# Patient Record
Sex: Male | Born: 1940
Health system: Southern US, Community
[De-identification: ages and names within clinical notes are randomized; demographics above are authoritative.]

## PROBLEM LIST (undated history)

## (undated) DIAGNOSIS — C801 Malignant (primary) neoplasm, unspecified: Secondary | ICD-10-CM

## (undated) DIAGNOSIS — E278 Other specified disorders of adrenal gland: Secondary | ICD-10-CM

## (undated) DIAGNOSIS — I82409 Acute embolism and thrombosis of unspecified deep veins of unspecified lower extremity: Secondary | ICD-10-CM

## (undated) DIAGNOSIS — E785 Hyperlipidemia, unspecified: Secondary | ICD-10-CM

## (undated) DIAGNOSIS — I10 Essential (primary) hypertension: Secondary | ICD-10-CM

## (undated) HISTORY — DX: Essential (primary) hypertension: I10

## (undated) HISTORY — DX: Hyperlipidemia, unspecified: E78.5

## (undated) HISTORY — DX: Other specified disorders of adrenal gland: E27.8

## (undated) HISTORY — DX: Malignant (primary) neoplasm, unspecified: C80.1

---

## 1998-06-16 ENCOUNTER — Other Ambulatory Visit: Admission: RE | Admit: 1998-06-16 | Discharge: 1998-06-16 | Payer: Self-pay | Admitting: Urology

## 1998-07-29 ENCOUNTER — Encounter: Admission: RE | Admit: 1998-07-29 | Discharge: 1998-08-17 | Payer: Self-pay | Admitting: Family Medicine

## 1998-09-13 ENCOUNTER — Encounter: Admission: RE | Admit: 1998-09-13 | Discharge: 1998-12-12 | Payer: Self-pay | Admitting: Family Medicine

## 1998-11-01 ENCOUNTER — Encounter: Payer: Self-pay | Admitting: Family Medicine

## 1998-11-01 ENCOUNTER — Ambulatory Visit (HOSPITAL_COMMUNITY): Admission: RE | Admit: 1998-11-01 | Discharge: 1998-11-01 | Payer: Self-pay | Admitting: Family Medicine

## 1998-11-15 ENCOUNTER — Encounter: Payer: Self-pay | Admitting: Orthopedic Surgery

## 1998-11-15 ENCOUNTER — Ambulatory Visit (HOSPITAL_COMMUNITY): Admission: RE | Admit: 1998-11-15 | Discharge: 1998-11-15 | Payer: Self-pay | Admitting: Orthopedic Surgery

## 1998-11-29 ENCOUNTER — Encounter: Payer: Self-pay | Admitting: Orthopedic Surgery

## 1998-11-29 ENCOUNTER — Ambulatory Visit (HOSPITAL_COMMUNITY): Admission: RE | Admit: 1998-11-29 | Discharge: 1998-11-29 | Payer: Self-pay | Admitting: Orthopedic Surgery

## 1998-12-13 ENCOUNTER — Ambulatory Visit (HOSPITAL_COMMUNITY): Admission: RE | Admit: 1998-12-13 | Discharge: 1998-12-13 | Payer: Self-pay | Admitting: Orthopedic Surgery

## 1998-12-13 ENCOUNTER — Encounter: Payer: Self-pay | Admitting: Orthopedic Surgery

## 2000-07-08 ENCOUNTER — Encounter: Admission: RE | Admit: 2000-07-08 | Discharge: 2000-07-08 | Payer: Self-pay | Admitting: Internal Medicine

## 2000-07-08 ENCOUNTER — Encounter: Payer: Self-pay | Admitting: Internal Medicine

## 2000-07-17 ENCOUNTER — Encounter: Admission: RE | Admit: 2000-07-17 | Discharge: 2000-08-12 | Payer: Self-pay | Admitting: Neurosurgery

## 2005-03-13 ENCOUNTER — Inpatient Hospital Stay (HOSPITAL_COMMUNITY): Admission: EM | Admit: 2005-03-13 | Discharge: 2005-03-19 | Payer: Self-pay | Admitting: Emergency Medicine

## 2007-12-26 ENCOUNTER — Ambulatory Visit: Admission: RE | Admit: 2007-12-26 | Discharge: 2008-01-07 | Payer: Self-pay | Admitting: Radiation Oncology

## 2008-02-20 DIAGNOSIS — C801 Malignant (primary) neoplasm, unspecified: Secondary | ICD-10-CM

## 2008-02-20 HISTORY — DX: Malignant (primary) neoplasm, unspecified: C80.1

## 2008-04-22 ENCOUNTER — Ambulatory Visit: Admission: RE | Admit: 2008-04-22 | Discharge: 2008-07-21 | Payer: Self-pay | Admitting: Radiation Oncology

## 2008-04-23 ENCOUNTER — Encounter: Admission: RE | Admit: 2008-04-23 | Discharge: 2008-04-23 | Payer: Self-pay | Admitting: Urology

## 2008-05-28 ENCOUNTER — Ambulatory Visit (HOSPITAL_BASED_OUTPATIENT_CLINIC_OR_DEPARTMENT_OTHER): Admission: RE | Admit: 2008-05-28 | Discharge: 2008-05-28 | Payer: Self-pay | Admitting: Urology

## 2008-08-31 ENCOUNTER — Ambulatory Visit: Admission: RE | Admit: 2008-08-31 | Discharge: 2008-09-22 | Payer: Self-pay | Admitting: Radiation Oncology

## 2009-03-10 HISTORY — PX: OTHER SURGICAL HISTORY: SHX169

## 2010-04-05 HISTORY — PX: OTHER SURGICAL HISTORY: SHX169

## 2010-05-31 LAB — COMPREHENSIVE METABOLIC PANEL
ALT: 30 U/L (ref 0–53)
AST: 23 U/L (ref 0–37)
Albumin: 3.8 g/dL (ref 3.5–5.2)
Alkaline Phosphatase: 69 U/L (ref 39–117)
BUN: 14 mg/dL (ref 6–23)
CO2: 29 mEq/L (ref 19–32)
Calcium: 9.1 mg/dL (ref 8.4–10.5)
Chloride: 106 mEq/L (ref 96–112)
Creatinine, Ser: 1.1 mg/dL (ref 0.4–1.5)
GFR calc Af Amer: >60 mL/min (ref >60)
GFR calc non Af Amer: >60 mL/min (ref >60)
Glucose, Bld: 113 mg/dL — ABNORMAL HIGH (ref 70–99)
Potassium: 3.7 mEq/L (ref 3.5–5.1)
Sodium: 141 mEq/L (ref 135–145)
Total Bilirubin: 0.9 mg/dL (ref 0.3–1.2)
Total Protein: 6.6 g/dL (ref 6.0–8.3)

## 2010-05-31 LAB — CBC
HCT: 45 % (ref 39.0–52.0)
Hemoglobin: 15.5 g/dL (ref 13.0–17.0)
MCHC: 34.4 g/dL (ref 30.0–36.0)
MCV: 102.6 fL — ABNORMAL HIGH (ref 78.0–100.0)
Platelets: 113 10*3/uL — ABNORMAL LOW (ref 150–400)
RBC: 4.39 MIL/uL (ref 4.22–5.81)
RDW: 14.1 % (ref 11.5–15.5)
WBC: 4.7 10*3/uL (ref 4.0–10.5)

## 2010-05-31 LAB — PROTIME-INR
INR: 1 (ref 0.00–1.49)
INR: 1.9 — ABNORMAL HIGH (ref 0.00–1.49)
Prothrombin Time: 13.8 seconds (ref 11.6–15.2)
Prothrombin Time: 22.8 seconds — ABNORMAL HIGH (ref 11.6–15.2)

## 2010-05-31 LAB — APTT
aPTT: 26 seconds (ref 24–37)
aPTT: 31 seconds (ref 24–37)

## 2010-07-04 NOTE — Op Note (Signed)
NAME:  Pedro Kent, Pedro Kent               ACCOUNT NO.:  [id number]   MEDICAL RECORD NO.:  [id number]          PATIENT TYPE:  AMB   LOCATION:  [facility]                         FACILITY:  [facility]   PHYSICIAN:  Mark C. Vernie Ammons, M.D.  DATE OF BIRTH:  1940/09/08   DATE OF PROCEDURE:  05/28/2008  DATE OF DISCHARGE:                               OPERATIVE REPORT   PREOPERATIVE DIAGNOSIS:  Adenocarcinoma of the prostate.   POSTOPERATIVE DIAGNOSIS:  Adenocarcinoma of the prostate.   PROCEDURE:  I-125 radioactive seed implantation.   SURGEON:  Mark C. Vernie Ammons, M.D.   RADIATION ONCOLOGIST:  Artist Pais. Kathrynn Running, M.D.   ANESTHESIA:  General.   DRAINS:  16 French Foley catheter.   SPECIMENS:  None.   ESTIMATED BLOOD LOSS:  Minimal.   COMPLICATIONS:  None.   INDICATIONS:  The patient is a 70 year old male with biopsy proven  adenocarcinoma of the prostate.  He was found to have an apical nodule  and a PSA of 14.5 prompting a biopsy of the prostate which revealed  adenocarcinoma Gleason score 3+4 involving 3 cores from the left portion  of the prostate.  The treatment options as well as the risks and  complications of radioactive seed implant were discussed in detail with  the patient and he has elected to proceed with seed implantation.   DESCRIPTION OF OPERATION:  After informed consent the patient was  brought to the major OR and placed on the table and administered general  anesthesia.  He was then moved to the dorsal lithotomy position and the  Foley catheter was initially placed using half strength contrast in the  balloon.  The rectal probe and rectal tube were then placed and the  rectal probe was affixed to the stand.   Real time ultrasonography was then performed and real time planning was  performed by Dr. Kathrynn Running.  This indicated the need for the placement of  90 seeds using 24 needles.   Using the Nucletron and real time ultrasonography all of the needles  were placed without  difficulty and the seeds implanted.  I then removed  the rectal probe and rectal tube as well as Foley catheter and reprepped  the penis.   Cystoscopy was performed using the 17 French flexible scope.  The  urethra was noted to be normal without lesion.  The prostatic urethra  revealed some elongation but no lesions and also no foreign bodies or  stones were seen.  Upon entering the bladder I noted the bladder had  normal appearing mucosa.  It was fully and systematically inspected and  noted to be free of any tumor, stones or inflammatory lesions.  Ureteral  orifices were normal configuration and position.  There were no seeds  noted on the floor of the bladder nor were there any seeds noted  protruding from the base of the prostate upon retroflexion of the scope.  There was 1+ trabeculation noted.   The flexible cystoscope was removed and a new Foley catheter was  inserted and connected to closed system drainage and the patient was  awakened and taken to recovery  room in stable and satisfactory  condition.  He tolerated the procedure well with no intraoperative  complications.   He was given a prescription for 24 Vicodin HP, 10 Cipro 500 mg and  Flomax 0.4 mg #30.  He will follow up in my office in 3 weeks and with  Dr. Kathrynn Running at that time as well.      Mark C. Vernie Ammons, M.D.  Electronically Signed     MCO/MEDQ  D:  05/28/2008  T:  05/28/2008  Job:  [id number]   cc:   Artist Pais Kathrynn Running, M.D.  Fax: [phone]

## 2010-07-07 NOTE — Consult Note (Signed)
Pedro Kent, Pedro Kent               ACCOUNT NO.:  [id number]   MEDICAL RECORD NO.:  [id number]          PATIENT TYPE:  INP   LOCATION:  5729                         FACILITY:  [facility]   PHYSICIAN:  Alfonse Alpers. Gegick, M.D.DATE OF BIRTH:  1940-10-20   DATE OF CONSULTATION:  03/16/2005  DATE OF DISCHARGE:                                   CONSULTATION   HISTORY:  This is a 70 year old man who presented to the hospital with a  history of left leg swelling.  The patient has had a history of deep venous  thrombosis of acute onset and apparently no precipitating causes have been  detected.  A CT scan of his abdomen was done and this showed bilateral  adrenal hyperplasia.  He has a history of hypertension which has been  present for approximately 30 years.  He has been taking multiple medications  including diuretics.  He has not been taking any potassium but was started  on Spironolactone approximately one month ago and this has been increased  recently to twice a day.  The patient has also been taking a diuretic and  also a beta blocker.   PAST MEDICAL HISTORY:  Essentially negative.  One year ago, he had a cardiac  study and this was apparently normal.  He has not had any other  hospitalizations.   MEDICATIONS PRIOR TO ADMISSION:  Norvasc, enteric coated aspirin once a day,  Spironolactone 12.5 mg once daily, Diovan 300 mg once daily, Lopressor 50 mg  b.i.d., Demadex 20 mg daily.   FAMILY HISTORY:  His mother died at the age of 57, his father died at the  age of 38.  Neither of them had any history of clot problems.   PERSONAL HISTORY:  He does not smoke or drink excessive amounts of alcohol.  He is not allergic to any medications.  There is a note in the chart  indicating his intolerance to ACE inhibitors.   REVIEW OF SYSTEMS:  His weight has decreased about 40 or 50 pounds and he  relates this to diet and exercise.  He has been strenuously working out.  He  denies any use of any type  of hormonal agent.  Cardiovascular/respiratory:  No history of chest pain.  GI:  No symptoms.   PHYSICAL EXAMINATION:  GENERAL:  This is a well developed man who appears in no distress.  LUNGS:  Clear.  NECK:  Supple, the thyroid is multinodular in texture, it is slightly  enlarged.  CARDIOVASCULAR:  Rhythm is regular.  ABDOMEN:  Soft, no masses are present, no tenderness is present.  EXTREMITIES:  Moderate swelling is present in the left leg at this time.   IMPRESSION:  1.  Deep venous thrombosis of undetermined origin.  2.  Adrenal hyperplasia, etiology to be determined.   DISCUSSION:  It is quite possible that the patient has been on Diovan and,  also, Spironolactone for a chronic period of time along with diuretic  medications.  This may be causing his adrenal hyperplasia in which case it  would require medication adjustment.  The other possibility, of course,  is  that the patient has adrenal disease.  A cortisol level will be obtained.  In addition, a plasma renin activity and plasma aldosterone level will be  obtained.  These studies would be probably not very helpful in view of the  fact that he is, already, taking Aldactone and Demadex.  It will be  necessary at this time to make a definitive diagnosis to keep him off of  medications for two weeks and this includes his diuretics, beta blockers,  and angiotensin converting inhibitors along with angiotensin receptor  blockers.  After two weeks, we will do an upright study measuring plasma  renin activity, aldosterone levels, and determine whether this is autonomous  functioning of the adrenal glands.   Thank you for the opportunity in seeing this patient.           ______________________________  Alfonse Alpers. Dagoberto Ligas, M.D.     CGG/MEDQ  D:  03/16/2005  T:  03/16/2005  Job:  [id number]

## 2010-07-07 NOTE — Discharge Summary (Signed)
Pedro Kent, Pedro Kent               ACCOUNT NO.:  [id number]   MEDICAL RECORD NO.:  [id number]          PATIENT TYPE:  INP   LOCATION:  5729                         FACILITY:  [facility]   PHYSICIAN:  Nicki Guadalajara, M.D.     DATE OF BIRTH:  1940-06-12   DATE OF ADMISSION:  03/13/2005  DATE OF DISCHARGE:  03/19/2005                                 DISCHARGE SUMMARY   DISCHARGE DIAGNOSES:  1.  Deep vein thrombosis left common femoral vein, Coumadin added this      admission.  2.  History of hypertension with diastolic dysfunction.   HOSPITAL COURSE:  The patient is a pleasant 70 year old male followed by Dr.  Tresa Endo with history of hypertension. 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 He  presented to the office with the hypertension in December. Dr. Tresa Endo  adjusted his medicines then. He apparently had some swelling in his leg and  a Doppler study done on March 13, 2005 showed a free-floating thrombus  measuring 4.6 cm in the left common vein and left superficial femoral vein,  popliteal vein and peroneal vein DVT. The patient was admitted to [facility] and started on IV heparin and Coumadin. He was seen in consult by  the Endocrinology Service for possible hypoaldosteronism but Dr. Dagoberto Ligas will  work him up further if needed as an outpatient. He would need to come off  diuretics, Diovan and Aldactone for this. We feel the patient can be  discharged March 19, 2005. His INR is 2.3. we will defer further workup  for hypoaldosteronism to Dr. Tresa Endo.   DISCHARGE MEDICATIONS:  1.  Coumadin 7.5 milligrams a day.  2.  Prilosec OTC once a day.  3.  Spironolactone 12.5 milligrams a day.  4.  Diovan 320 milligrams a day.  5.  Metoprolol 50 milligrams twice a day.  6.  Torsemide 20 milligrams a day.  7.  Zyrtec p.r.n.  8.  Aspirin 81 milligrams a day.  9.  Norvasc 10 milligrams a day.  10. Multivitamin daily.   LABS:  Sodium 138, potassium 3.7, BUN 14, creatinine 1.3, INR is 2.3.  White  count 4.6, hemoglobin 14.8, hematocrit 42.5, platelets 190,000. Cortisol  level was 20.4. Stool was negative. Renin activity is pending. Calcium  ionized is 1.28. Liver functions were normal. Lipid panel shows a total  cholesterol of 164, then HDL 38, LDL 111. TSH is 0.75. PTH intact 73.2. CEA  is 2.0. PSA was 13.05. Urinalysis is unremarkable. Abdominal CT with  contrast shows thickening of the adrenal glands bilaterally consistent with  adrenal hyperplasia, there were also multiple too small to characterize low  attenuated lesions in the liver, largest in the left lobe was consistent  with a cyst. CT of the pelvis showed prostate gland enlargement and  diverticulosis without diverticulitis.   PLAN:  The patient was discharged in stable condition. He has apparently had  elevated PSA in the past with negative biopsies. We will defer further  workup to his elevated PSA to Dr. Tresa Endo. We will also defer further  endocrinologic workup to Dr. Tresa Endo. He  will have a pro time checked later  this week and see Dr. Tresa Endo in a week or two in the office.      Abelino Derrick, P.A.    ______________________________  Nicki Guadalajara, M.D.    Lenard Lance  D:  03/19/2005  T:  03/19/2005  Job:  [id number]   cc:   Nicki Guadalajara, M.D.  Fax: [phone]   Alfonse Alpers. Dagoberto Ligas, M.D.  Fax: [phone]

## 2011-02-06 ENCOUNTER — Other Ambulatory Visit: Payer: Self-pay | Admitting: Radiation Oncology

## 2011-02-06 DIAGNOSIS — C61 Malignant neoplasm of prostate: Secondary | ICD-10-CM

## 2011-06-06 ENCOUNTER — Ambulatory Visit
Admission: RE | Admit: 2011-06-06 | Discharge: 2011-06-06 | Disposition: A | Discharge status: Home / Self Care | Payer: Medicare Other | Source: Ambulatory Visit | Attending: Cardiovascular Disease | Admitting: Cardiovascular Disease

## 2011-06-06 ENCOUNTER — Other Ambulatory Visit: Payer: Self-pay | Admitting: Cardiovascular Disease

## 2011-06-06 DIAGNOSIS — R0602 Shortness of breath: Secondary | ICD-10-CM

## 2011-08-08 ENCOUNTER — Other Ambulatory Visit: Payer: Self-pay | Admitting: Radiation Oncology

## 2012-02-04 ENCOUNTER — Encounter: Payer: Self-pay | Admitting: Pharmacist Clinician (PhC)/ Clinical Pharmacy Specialist

## 2012-02-04 DIAGNOSIS — E785 Hyperlipidemia, unspecified: Secondary | ICD-10-CM

## 2012-02-04 DIAGNOSIS — I10 Essential (primary) hypertension: Secondary | ICD-10-CM

## 2012-02-05 ENCOUNTER — Other Ambulatory Visit: Payer: Self-pay | Admitting: Radiation Oncology

## 2012-05-08 ENCOUNTER — Ambulatory Visit: Payer: Self-pay | Admitting: Cardiovascular Disease

## 2012-05-08 DIAGNOSIS — Z7901 Long term (current) use of anticoagulants: Secondary | ICD-10-CM | POA: Insufficient documentation

## 2012-05-08 DIAGNOSIS — I82409 Acute embolism and thrombosis of unspecified deep veins of unspecified lower extremity: Secondary | ICD-10-CM | POA: Insufficient documentation

## 2012-07-04 ENCOUNTER — Other Ambulatory Visit: Payer: Self-pay | Admitting: *Deleted

## 2012-07-04 MED ORDER — AMLODIPINE BESYLATE 10 MG PO TABS: 10.0000 mg | ORAL_TABLET | Freq: Every day | ORAL | Status: DC

## 2012-07-04 NOTE — Telephone Encounter (Signed)
Pt last seen 05/02/12 rx sent to pharmacy by e-script

## 2012-07-15 ENCOUNTER — Other Ambulatory Visit: Payer: Self-pay | Admitting: Cardiovascular Disease

## 2012-07-16 ENCOUNTER — Other Ambulatory Visit: Payer: Self-pay | Admitting: Cardiovascular Disease

## 2012-07-21 ENCOUNTER — Ambulatory Visit (INDEPENDENT_AMBULATORY_CARE_PROVIDER_SITE_OTHER): Payer: Medicare Other | Admitting: Pharmacist Clinician (PhC)/ Clinical Pharmacy Specialist

## 2012-07-21 VITALS — BP 120/64 | HR 68

## 2012-07-21 DIAGNOSIS — Z7901 Long term (current) use of anticoagulants: Secondary | ICD-10-CM

## 2012-07-21 DIAGNOSIS — I82409 Acute embolism and thrombosis of unspecified deep veins of unspecified lower extremity: Secondary | ICD-10-CM

## 2012-07-21 LAB — POCT INR: INR: 3.3

## 2012-07-22 ENCOUNTER — Other Ambulatory Visit: Payer: Self-pay | Admitting: Cardiovascular Disease

## 2012-08-07 ENCOUNTER — Other Ambulatory Visit: Payer: Self-pay | Admitting: Radiation Oncology

## 2012-08-08 ENCOUNTER — Other Ambulatory Visit: Payer: Self-pay | Admitting: Cardiovascular Disease

## 2012-08-12 NOTE — Telephone Encounter (Signed)
Rx was sent to pharmacy electronically. 

## 2012-08-18 ENCOUNTER — Ambulatory Visit (INDEPENDENT_AMBULATORY_CARE_PROVIDER_SITE_OTHER): Payer: Medicare Other | Admitting: Pharmacist Clinician (PhC)/ Clinical Pharmacy Specialist

## 2012-08-18 VITALS — BP 120/62 | HR 76

## 2012-08-18 DIAGNOSIS — I82409 Acute embolism and thrombosis of unspecified deep veins of unspecified lower extremity: Secondary | ICD-10-CM

## 2012-08-18 DIAGNOSIS — Z7901 Long term (current) use of anticoagulants: Secondary | ICD-10-CM

## 2012-09-01 ENCOUNTER — Ambulatory Visit (INDEPENDENT_AMBULATORY_CARE_PROVIDER_SITE_OTHER): Payer: Medicare Other | Admitting: Pharmacist Clinician (PhC)/ Clinical Pharmacy Specialist

## 2012-09-01 VITALS — BP 134/70 | HR 72

## 2012-09-01 DIAGNOSIS — Z7901 Long term (current) use of anticoagulants: Secondary | ICD-10-CM

## 2012-09-01 DIAGNOSIS — I82409 Acute embolism and thrombosis of unspecified deep veins of unspecified lower extremity: Secondary | ICD-10-CM

## 2012-09-01 LAB — POCT INR: INR: 2.7

## 2012-09-17 ENCOUNTER — Other Ambulatory Visit (HOSPITAL_COMMUNITY): Payer: Self-pay | Admitting: Cardiovascular Disease

## 2012-09-17 DIAGNOSIS — I119 Hypertensive heart disease without heart failure: Secondary | ICD-10-CM

## 2012-09-23 ENCOUNTER — Ambulatory Visit (HOSPITAL_COMMUNITY)
Admission: RE | Admit: 2012-09-23 | Discharge: 2012-09-23 | Disposition: A | Discharge status: Home / Self Care | Payer: Medicare Other | Source: Ambulatory Visit | Attending: Cardiovascular Disease | Admitting: Cardiovascular Disease

## 2012-09-23 DIAGNOSIS — I059 Rheumatic mitral valve disease, unspecified: Secondary | ICD-10-CM | POA: Insufficient documentation

## 2012-09-23 DIAGNOSIS — I10 Essential (primary) hypertension: Secondary | ICD-10-CM | POA: Insufficient documentation

## 2012-09-23 DIAGNOSIS — I119 Hypertensive heart disease without heart failure: Secondary | ICD-10-CM

## 2012-09-23 DIAGNOSIS — I079 Rheumatic tricuspid valve disease, unspecified: Secondary | ICD-10-CM | POA: Insufficient documentation

## 2012-09-23 DIAGNOSIS — I517 Cardiomegaly: Secondary | ICD-10-CM | POA: Insufficient documentation

## 2012-09-23 NOTE — Progress Notes (Signed)
[facility]   2D echo completed 09/23/2012.   Cindy Louise Victory, RDCS  

## 2012-09-29 ENCOUNTER — Ambulatory Visit (INDEPENDENT_AMBULATORY_CARE_PROVIDER_SITE_OTHER): Payer: Medicare Other | Admitting: Pharmacist Clinician (PhC)/ Clinical Pharmacy Specialist

## 2012-09-29 VITALS — BP 138/68 | HR 72

## 2012-09-29 DIAGNOSIS — Z7901 Long term (current) use of anticoagulants: Secondary | ICD-10-CM

## 2012-09-29 DIAGNOSIS — I82409 Acute embolism and thrombosis of unspecified deep veins of unspecified lower extremity: Secondary | ICD-10-CM

## 2012-10-02 ENCOUNTER — Other Ambulatory Visit: Payer: Self-pay

## 2012-10-02 MED ORDER — METOPROLOL TARTRATE 50 MG PO TABS: 50.0000 mg | ORAL_TABLET | Freq: Two times a day (BID) | ORAL | Status: DC

## 2012-10-02 MED ORDER — AMLODIPINE BESYLATE 10 MG PO TABS: 10.0000 mg | ORAL_TABLET | Freq: Every day | ORAL | Status: DC

## 2012-10-02 MED ORDER — SPIRONOLACTONE 25 MG PO TABS: 25.0000 mg | ORAL_TABLET | Freq: Every day | ORAL | Status: DC

## 2012-10-02 MED ORDER — WARFARIN SODIUM 5 MG PO TABS: ORAL_TABLET | ORAL | Status: DC

## 2012-10-02 MED ORDER — ATORVASTATIN CALCIUM 10 MG PO TABS: 10.0000 mg | ORAL_TABLET | Freq: Every day | ORAL | Status: DC

## 2012-10-02 NOTE — Telephone Encounter (Signed)
Rx was sent to pharmacy electronically. 

## 2012-10-03 ENCOUNTER — Encounter: Payer: Self-pay | Admitting: *Deleted

## 2012-10-23 ENCOUNTER — Encounter: Payer: Self-pay | Admitting: Physician Assistant

## 2012-10-27 ENCOUNTER — Encounter: Payer: Self-pay | Admitting: Cardiovascular Disease

## 2012-10-27 ENCOUNTER — Ambulatory Visit (INDEPENDENT_AMBULATORY_CARE_PROVIDER_SITE_OTHER): Payer: Medicare Other | Admitting: Pharmacist Clinician (PhC)/ Clinical Pharmacy Specialist

## 2012-10-27 ENCOUNTER — Ambulatory Visit (INDEPENDENT_AMBULATORY_CARE_PROVIDER_SITE_OTHER): Payer: Medicare Other | Admitting: Cardiovascular Disease

## 2012-10-27 VITALS — BP 130/70 | HR 57 | Ht 72.0 in | Wt 217.3 lb

## 2012-10-27 DIAGNOSIS — Z7901 Long term (current) use of anticoagulants: Secondary | ICD-10-CM

## 2012-10-27 DIAGNOSIS — I82409 Acute embolism and thrombosis of unspecified deep veins of unspecified lower extremity: Secondary | ICD-10-CM

## 2012-10-27 DIAGNOSIS — I119 Hypertensive heart disease without heart failure: Secondary | ICD-10-CM

## 2012-10-27 DIAGNOSIS — R9431 Abnormal electrocardiogram [ECG] [EKG]: Secondary | ICD-10-CM

## 2012-10-27 DIAGNOSIS — Z8546 Personal history of malignant neoplasm of prostate: Secondary | ICD-10-CM

## 2012-10-27 DIAGNOSIS — I10 Essential (primary) hypertension: Secondary | ICD-10-CM

## 2012-10-27 DIAGNOSIS — E785 Hyperlipidemia, unspecified: Secondary | ICD-10-CM

## 2012-10-27 LAB — POCT INR: INR: 2.8

## 2012-10-27 NOTE — Patient Instructions (Signed)
Your physician recommends that you schedule a follow-up appointment in: 1 year. No changes has been made today in your therapy.

## 2012-10-27 NOTE — Progress Notes (Signed)
Patient ID: Pedro Kent, male   DOB: 1940/05/10, 72 y.o.   MRN: [id number]     HPI: Pedro Kent, is a 72 y.o. male who presents for six-month cardiology evaluation.  Pedro Kent has a history of deep vein thrombosis of his left lower extremity in January 2007 which is been on chronic anticoagulation therapy. Additional problems include hypertension with adrenal hyperplasia, history of prostate CA followed by Dr. Veverly Fells and with history of radiation seeds, history of hyperlipidemia, and an abnormal ECG with T wave inversion. An echo Doppler study done in January 2011  showed mild concentric LVH, mild MR with possible torn redundant chordae tendinea, mild aortic valve sclerosis, and mild coronary insufficiency. Over the past 6 months, he has continued to do well. He states he discontinued his tobacco habit which in the past he had been smoking pipes and cigars. He denies chest pressure. He is unaware of tachypalpitations. He denies shortness of breath. On 09/23/2012, Pedro Kent underwent a followup echo Doppler study. This showed an ejection fraction at 60-65%. He did have mild left ventricular hypertrophy and had normal diastolic parameters on this present study. There is very minimal pulmonary pressure elevation at 31 mm. Is mitral valve is mildly thickened with no significant regurgitation.  Past Medical History  Diagnosis Date  . Hypertension   . Cancer 2010    prostate cancer  . Hyperlipidemia   . Adrenal hyperplasia     Past Surgical History  Procedure Laterality Date  . 2-d echocardiogram  03/10/2009    Normal left ventricular systolic function. Mild MR. Normal RV systolic pressure. Mild pulmonic valvular regurgitation.  . Cardiac stress test  04/05/2010    Negative for ischemia    Allergies  Allergen Reactions  . Ace Inhibitors     Current Outpatient Prescriptions  Medication Sig Dispense Refill  . amLODipine (NORVASC) 10 MG tablet Take 1 tablet (10 mg total) by mouth  daily.  90 tablet  2  . aspirin EC 81 MG tablet Take 81 mg by mouth daily.      Marland Kitchen atorvastatin (LIPITOR) 10 MG tablet Take 1 tablet (10 mg total) by mouth daily.  90 tablet  2  . DIOVAN 320 MG tablet TAKE 1 TABLET DAILY.  90 tablet  3  . metoprolol (LOPRESSOR) 50 MG tablet Take 1 tablet (50 mg total) by mouth 2 (two) times daily.  180 tablet  2  . Misc Natural Products (MENS PROSTATE HEALTH FORMULA PO) Take 4 capsules by mouth daily.      . Multiple Vitamins-Minerals (MULTIVITAMIN) tablet Take 1 tablet by mouth daily.      Marland Kitchen omeprazole (PRILOSEC) 20 MG capsule Take 20 mg by mouth daily.      Marland Kitchen spironolactone (ALDACTONE) 25 MG tablet Take 1 tablet (25 mg total) by mouth daily.  90 tablet  2  . tamsulosin (FLOMAX) 0.4 MG CAPS TAKE ONE CAPSULE BY MOUTH TWICE A DAY  60 capsule  5  . warfarin (COUMADIN) 5 MG tablet Take 1 & 1/2 to 2 tablets by mouth daily as directed  150 tablet  2   No current facility-administered medications for this visit.    History   Social History  . Marital Status: Divorced    Spouse Name: N/A    Number of Children: N/A  . Years of Education: N/A   Occupational History  . Not on file.   Social History Main Topics  . Smoking status: Current Every Day Smoker -- 0.25 packs/day  Types: Cigars  . Smokeless tobacco: Not on file  . Alcohol Use: Yes     Comment: rarely  . Drug Use: Not on file  . Sexual Activity: Not on file   Other Topics Concern  . Not on file   Social History Narrative  . No narrative on file   Socially he has 2 children one grandchild. He does walk. He denies recent alcohol use.  History reviewed. No pertinent family history.  ROS is negative for fevers, chills or night sweats. He denies shortness of breath. He denies palpitations. No presyncope or syncope. He denies wheezing. He tells me he has no further evidence of his prior prostate cancer. He denies bleeding. He denies edema. He denies calf tenderness. Other system review is  negative.  PE BP 130/70  Pulse 57  Ht 6' (1.829 m)  Wt 217 lb 4.8 oz (98.567 kg)  BMI 29.46 kg/m2  General: Alert, oriented, no distress.  Skin: normal turgor, no rashes HEENT: Normocephalic, atraumatic. Pupils round and reactive; sclera anicteric;no lid lag.  Nose without nasal septal hypertrophy Mouth/Parynx benign; Mallinpatti scale 3 Neck: No JVD, no carotid briuts Lungs: clear to ausculatation and percussion; no wheezing or rales Heart: RRR, s1 s2 normal 1/6 systolic murmur, unchanged Abdomen: soft, nontender; no hepatosplenomehaly, BS+; abdominal aorta nontender and not dilated by palpation. Pulses 2+ Extremities: no clubbing cyanosis or edema, Homan's sign negative  Neurologic: grossly nonfocal  ECG: Sinus rhythm at 57 beats a minute. Previously noted T-wave abnormalities inferolaterally.  LABS:  BMET    Component Value Date/Time   NA 141 05/21/2008 0920   K 3.7 05/21/2008 0920   CL 106 05/21/2008 0920   CO2 29 05/21/2008 0920   GLUCOSE 113* 05/21/2008 0920   BUN 14 05/21/2008 0920   CREATININE 1.10 05/21/2008 0920   CALCIUM 9.1 05/21/2008 0920   GFRNONAA >60 05/21/2008 0920   GFRAA  Value: >60        The eGFR has been calculated using the MDRD equation. This calculation has not been validated in all clinical situations. eGFR's persistently <60 mL/min signify possible Chronic Kidney Disease. 05/21/2008 0920     Hepatic Function Panel     Component Value Date/Time   PROT 6.6 05/21/2008 0920   ALBUMIN 3.8 05/21/2008 0920   AST 23 05/21/2008 0920   ALT 30 05/21/2008 0920   ALKPHOS 69 05/21/2008 0920   BILITOT 0.9 05/21/2008 0920     CBC    Component Value Date/Time   WBC 4.7 05/21/2008 0920   RBC 4.39 05/21/2008 0920   HGB 15.5 05/21/2008 0920   HCT 45.0 05/21/2008 0920   PLT 113* 05/21/2008 0920   MCV 102.6* 05/21/2008 0920   MCHC 34.4 05/21/2008 0920   RDW 14.1 05/21/2008 0920     BNP No results found for this basename: probnp    Lipid Panel  No results found for this basename: chol,  trig, hdl, cholhdl, vldl, ldlcalc     RADIOLOGY: No results found.    ASSESSMENT AND PLAN: Pedro Kent is now 7-1/2 years following his deep vein thrombosis for which she's been on chronic Coumadin therapy. His blood pressure today is controlled. He has documented LVH with grade 1 diastolic dysfunction and abnormal T wave abnormalities. He did have a nuclear perfusion study in 2012 which showed normal perfusion with mild diaphragmatic attenuation. His most recent echo Doppler study on 09/23/2012 again shows ejection fraction 60-65% with mild LVH, mildly thickened mitral valve without significant regurgitation and  only minimal pulmonary hypertension with a slight artery systolic pressure 31 mm. I have recommended he continue his current medical regimen. As long as she remains stable I will see him in one year for followup evaluation.     Lennette Bihari, MD, [facility]  10/27/2012 3:05 PM

## 2012-11-24 ENCOUNTER — Ambulatory Visit (INDEPENDENT_AMBULATORY_CARE_PROVIDER_SITE_OTHER): Payer: Medicare Other | Admitting: Pharmacist Clinician (PhC)/ Clinical Pharmacy Specialist

## 2012-11-24 VITALS — BP 130/64 | HR 72

## 2012-11-24 DIAGNOSIS — I82409 Acute embolism and thrombosis of unspecified deep veins of unspecified lower extremity: Secondary | ICD-10-CM

## 2012-11-24 DIAGNOSIS — Z7901 Long term (current) use of anticoagulants: Secondary | ICD-10-CM

## 2012-12-16 ENCOUNTER — Encounter: Payer: Self-pay | Admitting: Cardiovascular Disease

## 2012-12-22 ENCOUNTER — Ambulatory Visit (INDEPENDENT_AMBULATORY_CARE_PROVIDER_SITE_OTHER): Payer: Medicare Other | Admitting: Pharmacist Clinician (PhC)/ Clinical Pharmacy Specialist

## 2012-12-22 VITALS — BP 124/62 | HR 68

## 2012-12-22 DIAGNOSIS — I82409 Acute embolism and thrombosis of unspecified deep veins of unspecified lower extremity: Secondary | ICD-10-CM

## 2012-12-22 DIAGNOSIS — Z7901 Long term (current) use of anticoagulants: Secondary | ICD-10-CM

## 2013-02-02 ENCOUNTER — Ambulatory Visit (INDEPENDENT_AMBULATORY_CARE_PROVIDER_SITE_OTHER): Payer: Medicare Other | Admitting: Pharmacist Clinician (PhC)/ Clinical Pharmacy Specialist

## 2013-02-02 VITALS — BP 118/66 | HR 64

## 2013-02-02 DIAGNOSIS — Z7901 Long term (current) use of anticoagulants: Secondary | ICD-10-CM

## 2013-02-02 DIAGNOSIS — I82409 Acute embolism and thrombosis of unspecified deep veins of unspecified lower extremity: Secondary | ICD-10-CM

## 2013-02-05 ENCOUNTER — Other Ambulatory Visit: Payer: Self-pay | Admitting: Radiation Oncology

## 2013-03-16 ENCOUNTER — Ambulatory Visit: Payer: Medicare Other | Admitting: Pharmacist Clinician (PhC)/ Clinical Pharmacy Specialist

## 2013-03-17 ENCOUNTER — Ambulatory Visit (INDEPENDENT_AMBULATORY_CARE_PROVIDER_SITE_OTHER): Payer: Medicare Other | Admitting: Pharmacist Clinician (PhC)/ Clinical Pharmacy Specialist

## 2013-03-17 VITALS — BP 118/60 | HR 60

## 2013-03-17 DIAGNOSIS — I82409 Acute embolism and thrombosis of unspecified deep veins of unspecified lower extremity: Secondary | ICD-10-CM

## 2013-03-17 DIAGNOSIS — Z7901 Long term (current) use of anticoagulants: Secondary | ICD-10-CM

## 2013-03-17 LAB — POCT INR: INR: 2.6

## 2013-04-29 ENCOUNTER — Ambulatory Visit (INDEPENDENT_AMBULATORY_CARE_PROVIDER_SITE_OTHER): Payer: Medicare Other | Admitting: Pharmacist Clinician (PhC)/ Clinical Pharmacy Specialist

## 2013-04-29 ENCOUNTER — Telehealth: Payer: Self-pay | Admitting: Pharmacist Clinician (PhC)/ Clinical Pharmacy Specialist

## 2013-04-29 DIAGNOSIS — I82409 Acute embolism and thrombosis of unspecified deep veins of unspecified lower extremity: Secondary | ICD-10-CM

## 2013-04-29 DIAGNOSIS — Z7901 Long term (current) use of anticoagulants: Secondary | ICD-10-CM

## 2013-04-29 LAB — POCT INR: INR: 3.1

## 2013-04-29 NOTE — Telephone Encounter (Signed)
Pt wanted to confirm INR was 3.1.  Verified with patient

## 2013-04-29 NOTE — Telephone Encounter (Signed)
Pt LMOM has question after INR appt today 3.1  Returned call, [facility]

## 2013-06-09 ENCOUNTER — Other Ambulatory Visit: Payer: Self-pay

## 2013-06-09 MED ORDER — AMLODIPINE BESYLATE 10 MG PO TABS: 10.0000 mg | ORAL_TABLET | Freq: Every day | ORAL | Status: DC

## 2013-06-09 NOTE — Telephone Encounter (Signed)
Rx was sent to pharmacy electronically. 

## 2013-06-10 ENCOUNTER — Ambulatory Visit (INDEPENDENT_AMBULATORY_CARE_PROVIDER_SITE_OTHER): Payer: Medicare Other | Admitting: Pharmacist Clinician (PhC)/ Clinical Pharmacy Specialist

## 2013-06-10 VITALS — BP 142/78 | HR 64

## 2013-06-10 DIAGNOSIS — Z7901 Long term (current) use of anticoagulants: Secondary | ICD-10-CM

## 2013-06-10 LAB — POCT INR: INR: 2.6

## 2013-07-22 ENCOUNTER — Ambulatory Visit (INDEPENDENT_AMBULATORY_CARE_PROVIDER_SITE_OTHER): Payer: Medicare Other | Admitting: Pharmacist Clinician (PhC)/ Clinical Pharmacy Specialist

## 2013-07-22 VITALS — BP 132/80 | HR 72

## 2013-07-22 DIAGNOSIS — Z7901 Long term (current) use of anticoagulants: Secondary | ICD-10-CM

## 2013-07-22 LAB — POCT INR: INR: 2.9

## 2013-07-24 ENCOUNTER — Other Ambulatory Visit: Payer: Self-pay | Admitting: *Deleted

## 2013-07-24 MED ORDER — ATORVASTATIN CALCIUM 10 MG PO TABS
10.0000 mg | ORAL_TABLET | Freq: Every day | ORAL | Status: DC
Start: 2013-07-24 — End: 2014-04-14

## 2013-07-31 ENCOUNTER — Other Ambulatory Visit: Payer: Self-pay | Admitting: *Deleted

## 2013-07-31 MED ORDER — VALSARTAN 320 MG PO TABS: 320.0000 mg | ORAL_TABLET | Freq: Every day | ORAL | Status: DC

## 2013-07-31 NOTE — Telephone Encounter (Signed)
Rx refill sent to patient pharmacy   

## 2013-08-01 ENCOUNTER — Telehealth: Payer: Self-pay | Admitting: Cardiovascular Disease

## 2013-08-03 ENCOUNTER — Other Ambulatory Visit: Payer: Self-pay | Admitting: Radiation Oncology

## 2013-08-03 NOTE — Telephone Encounter (Signed)
Closed encounter °

## 2013-08-09 ENCOUNTER — Other Ambulatory Visit: Payer: Self-pay | Admitting: Pharmacist Clinician (PhC)/ Clinical Pharmacy Specialist

## 2013-08-18 ENCOUNTER — Other Ambulatory Visit: Payer: Self-pay | Admitting: *Deleted

## 2013-08-18 MED ORDER — SPIRONOLACTONE 25 MG PO TABS: 25.0000 mg | ORAL_TABLET | Freq: Every day | ORAL | Status: DC

## 2013-08-18 NOTE — Telephone Encounter (Signed)
Rx was sent to pharmacy electronically. 

## 2013-09-02 ENCOUNTER — Ambulatory Visit (INDEPENDENT_AMBULATORY_CARE_PROVIDER_SITE_OTHER): Payer: Medicare Other | Admitting: Pharmacist

## 2013-09-02 DIAGNOSIS — Z7901 Long term (current) use of anticoagulants: Secondary | ICD-10-CM

## 2013-09-02 DIAGNOSIS — I82409 Acute embolism and thrombosis of unspecified deep veins of unspecified lower extremity: Secondary | ICD-10-CM

## 2013-09-02 LAB — POCT INR: INR: 2.9

## 2013-10-14 ENCOUNTER — Ambulatory Visit (INDEPENDENT_AMBULATORY_CARE_PROVIDER_SITE_OTHER): Payer: Medicare Other | Admitting: Pharmacist Clinician (PhC)/ Clinical Pharmacy Specialist

## 2013-10-14 VITALS — BP 126/78 | HR 68

## 2013-10-14 DIAGNOSIS — I82409 Acute embolism and thrombosis of unspecified deep veins of unspecified lower extremity: Secondary | ICD-10-CM

## 2013-10-14 DIAGNOSIS — Z7901 Long term (current) use of anticoagulants: Secondary | ICD-10-CM

## 2013-10-14 LAB — POCT INR: INR: 3

## 2013-11-09 ENCOUNTER — Other Ambulatory Visit: Payer: Self-pay | Admitting: Cardiovascular Disease

## 2013-11-10 NOTE — Telephone Encounter (Signed)
Rx was sent to pharmacy electronically. 

## 2013-11-25 ENCOUNTER — Ambulatory Visit (INDEPENDENT_AMBULATORY_CARE_PROVIDER_SITE_OTHER): Payer: Medicare Other | Admitting: Pharmacist Clinician (PhC)/ Clinical Pharmacy Specialist

## 2013-11-25 VITALS — BP 132/70 | HR 60

## 2013-11-25 DIAGNOSIS — I82409 Acute embolism and thrombosis of unspecified deep veins of unspecified lower extremity: Secondary | ICD-10-CM

## 2013-11-25 DIAGNOSIS — Z7901 Long term (current) use of anticoagulants: Secondary | ICD-10-CM

## 2013-11-25 LAB — POCT INR: INR: 2.8

## 2013-11-29 ENCOUNTER — Other Ambulatory Visit: Payer: Self-pay | Admitting: Cardiovascular Disease

## 2013-11-30 NOTE — Telephone Encounter (Signed)
Rx was sent to pharmacy electronically. OV 12/21/13

## 2013-12-21 ENCOUNTER — Ambulatory Visit (INDEPENDENT_AMBULATORY_CARE_PROVIDER_SITE_OTHER): Payer: Medicare Other | Admitting: Pharmacist Clinician (PhC)/ Clinical Pharmacy Specialist

## 2013-12-21 ENCOUNTER — Other Ambulatory Visit: Payer: Self-pay | Admitting: Cardiovascular Disease

## 2013-12-21 ENCOUNTER — Encounter: Payer: Self-pay | Admitting: Cardiovascular Disease

## 2013-12-21 ENCOUNTER — Ambulatory Visit (INDEPENDENT_AMBULATORY_CARE_PROVIDER_SITE_OTHER): Payer: Medicare Other | Admitting: Cardiovascular Disease

## 2013-12-21 VITALS — BP 120/74 | HR 62 | Ht 72.0 in | Wt 218.4 lb

## 2013-12-21 DIAGNOSIS — Z7901 Long term (current) use of anticoagulants: Secondary | ICD-10-CM

## 2013-12-21 DIAGNOSIS — I82409 Acute embolism and thrombosis of unspecified deep veins of unspecified lower extremity: Secondary | ICD-10-CM

## 2013-12-21 DIAGNOSIS — E785 Hyperlipidemia, unspecified: Secondary | ICD-10-CM

## 2013-12-21 DIAGNOSIS — Z8546 Personal history of malignant neoplasm of prostate: Secondary | ICD-10-CM

## 2013-12-21 DIAGNOSIS — R9431 Abnormal electrocardiogram [ECG] [EKG]: Secondary | ICD-10-CM

## 2013-12-21 DIAGNOSIS — Z79899 Other long term (current) drug therapy: Secondary | ICD-10-CM

## 2013-12-21 DIAGNOSIS — I10 Essential (primary) hypertension: Secondary | ICD-10-CM

## 2013-12-21 DIAGNOSIS — Z720 Tobacco use: Secondary | ICD-10-CM | POA: Insufficient documentation

## 2013-12-21 LAB — COMPREHENSIVE METABOLIC PANEL
ALBUMIN: 4.4 g/dL (ref 3.5–5.2)
ALT: 32 U/L (ref 0–53)
AST: 24 U/L (ref 0–37)
Alkaline Phosphatase: 59 U/L (ref 39–117)
BILIRUBIN TOTAL: 0.7 mg/dL (ref 0.2–1.2)
BUN: 14 mg/dL (ref 6–23)
CO2: 27 meq/L (ref 19–32)
Calcium: 9 mg/dL (ref 8.4–10.5)
Chloride: 105 mEq/L (ref 96–112)
Creat: 1.11 mg/dL (ref 0.50–1.35)
Glucose, Bld: 101 mg/dL — ABNORMAL HIGH (ref 70–99)
POTASSIUM: 4.5 meq/L (ref 3.5–5.3)
SODIUM: 139 meq/L (ref 135–145)
TOTAL PROTEIN: 6.6 g/dL (ref 6.0–8.3)

## 2013-12-21 LAB — CBC
HCT: 43.1 % (ref 39.0–52.0)
HEMOGLOBIN: 15 g/dL (ref 13.0–17.0)
MCH: 35 pg — AB (ref 26.0–34.0)
MCHC: 34.8 g/dL (ref 30.0–36.0)
MCV: 100.7 fL — ABNORMAL HIGH (ref 78.0–100.0)
Platelets: 147 10*3/uL — ABNORMAL LOW (ref 150–400)
RBC: 4.28 MIL/uL (ref 4.22–5.81)
RDW: 13.9 % (ref 11.5–15.5)
WBC: 4 10*3/uL (ref 4.0–10.5)

## 2013-12-21 LAB — LIPID PANEL
CHOLESTEROL: 132 mg/dL (ref 0–200)
HDL: 44 mg/dL (ref >39)
LDL Cholesterol: 70 mg/dL (ref 0–99)
Total CHOL/HDL Ratio: 3 Ratio
Triglycerides: 88 mg/dL (ref <150)
VLDL: 18 mg/dL (ref 0–40)

## 2013-12-21 LAB — TSH: TSH: 0.879 u[IU]/mL (ref 0.350–4.500)

## 2013-12-21 LAB — POCT INR: INR: 2.7

## 2013-12-21 NOTE — Patient Instructions (Signed)
Your physician recommends that you return for lab work fasting.  Your physician wants you to follow-up in: 1 year or sooner if needed with Dr. Kelly. You will receive a reminder letter in the mail two months in advance. If you don't receive a letter, please call our office to schedule the follow-up appointment. 

## 2013-12-21 NOTE — Progress Notes (Signed)
Patient ID: Pedro Kent, male   DOB: [birth year], 73 y.o.   MRN: [id number]      HPI: Pedro Kent is a 73 y.o. male who presents for a one-year follow-up cardiology evaluation.  Pedro Kent developed a  deep vein thrombosis of his left lower extremity in January 2007and has been on chronic anticoagulation therapy with Coumadin ever since. Additional problems include hypertension with adrenal hyperplasia, history of prostate CA followed with history of radiation seeds, history of hyperlipidemia, and an abnormal ECG with T wave inversion. An echo Doppler study in January 2011  showed mild concentric LVH, mild MR with possible torn redundant chordae tendinea, mild aortic valve sclerosis, and mild coronary insufficiency.   On 09/23/2012 an echo Doppler study showed an ejection fraction at 60-65%. He had mild left ventricular hypertrophy and had normal diastolic parameters on this present study. There is very minimal pulmonary pressure elevation at 31 mm. Is mitral valve is mildly thickened with no significant regurgitation.  Over the past year, he feels that he is done fairly well.  He is on a 4 drug regimen for hypertension, consisting of amlodipine 10 mg, metoprolol 50 mg twice a day, spironolactone 25 mg daily and valsartan 320 mg.  He is on Prilosec for GERD.  He is taking Lipitor 10 mg for hyperlipidemia.  He is on Coumadin anticoagulation.  An INR today was checked and this is 2.7.  He continues to smoke cigars typically smokes 2 small cigars per day.    Past Medical History  Diagnosis Date  . Hypertension   . Cancer 2010    prostate cancer  . Hyperlipidemia   . Adrenal hyperplasia     Past Surgical History  Procedure Laterality Date  . 2-d echocardiogram  03/10/2009    Normal left ventricular systolic function. Mild MR. Normal RV systolic pressure. Mild pulmonic valvular regurgitation.  . Cardiac stress test  04/05/2010    Negative for ischemia    Allergies  Allergen  Reactions  . Ace Inhibitors     Current Outpatient Prescriptions  Medication Sig Dispense Refill  . amLODipine (NORVASC) 10 MG tablet Take 1 tablet (10 mg total) by mouth daily. 90 tablet 1  . aspirin EC 81 MG tablet Take 81 mg by mouth daily.    Marland Kitchen atorvastatin (LIPITOR) 10 MG tablet Take 1 tablet (10 mg total) by mouth daily. 90 tablet 2  . metoprolol (LOPRESSOR) 50 MG tablet Take 1 tablet (50 mg total) by mouth 2 (two) times daily. <appointment with Dr. Claiborne Billings on 12/21/13> 180 tablet 0  . Misc Natural Products (MENS PROSTATE HEALTH FORMULA PO) Take 4 capsules by mouth daily.    . Multiple Vitamins-Minerals (MULTIVITAMIN) tablet Take 1 tablet by mouth daily.    Marland Kitchen omeprazole (PRILOSEC) 20 MG capsule Take 20 mg by mouth daily.    Marland Kitchen spironolactone (ALDACTONE) 25 MG tablet Take 1 tablet (25 mg total) by mouth daily. MUST KEEP APPOINTMENT 12/21/2013 FOR FUTURE REFILLS. 90 tablet 0  . tamsulosin (FLOMAX) 0.4 MG CAPS capsule TAKE ONE CAPSULE BY MOUTH TWICE A DAY 60 capsule 5  . valsartan (DIOVAN) 320 MG tablet Take 1 tablet (320 mg total) by mouth daily. 90 tablet 2  . warfarin (COUMADIN) 5 MG tablet TAKE 1 & 1/2 TO 2 TABLETS BY MOUTH DAILY AS DIRECTED 150 tablet 2   No current facility-administered medications for this visit.    History   Social History  . Marital Status: Divorced    Spouse Name:  N/A    Number of Children: N/A  . Years of Education: N/A   Occupational History  . Not on file.   Social History Main Topics  . Smoking status: Current Every Day Smoker -- 0.25 packs/day    Types: Cigars  . Smokeless tobacco: Not on file  . Alcohol Use: Yes     Comment: rarely  . Drug Use: Not on file  . Sexual Activity: Not on file   Other Topics Concern  . Not on file   Social History Narrative   Socially he has 2 children one grandchild. He does walk. He denies recent alcohol use.  History reviewed. No pertinent family history.   ROS General: Negative; No fevers, chills, or  night sweats;  HEENT: Negative; No changes in vision or hearing, sinus congestion, difficulty swallowing Pulmonary: Negative; No cough, wheezing, shortness of breath, hemoptysis Cardiovascular: Negative; No chest pain, presyncope, syncope, palpitations GI: positive for GERD on omeprazole No nausea, vomiting, diarrhea, or abdominal pain GU: Negative; No dysuria, hematuria, or difficulty voiding Musculoskeletal: Negative; no myalgias, joint pain, or weakness Hematologic/Oncology: Negative; no easy bruising, bleeding Endocrine: Negative; no heat/cold intolerance; no diabetes Neuro: Negative; no changes in balance, headaches Skin: Negative; No rashes or skin lesions Psychiatric: Negative; No behavioral problems, depression Sleep: Negative; No snoring, daytime sleepiness, hypersomnolence, bruxism, restless legs, hypnogognic hallucinations, no cataplexy Other comprehensive 14 point system review is negative.   PE BP 120/74 mmHg  Pulse 62  Ht 6' (1.829 m)  Wt 218 lb 6.4 oz (99.066 kg)  BMI 29.61 kg/m2  General: Alert, oriented, no distress.  Skin: normal turgor, no rashes HEENT: Normocephalic, atraumatic. Pupils round and reactive; sclera anicteric;no lid lag.  Nose without nasal septal hypertrophy Mouth/Parynx benign; Mallinpatti scale 3 Neck: No JVD, no carotid bruits with normal carotid upstroke Lungs: clear to ausculatation and percussion; no wheezing or rales Chest wall: Nontender to palpation Heart: RRR, s1 s2 normal 1/6 systolic murmur, unchanged; .  No diastolic murmur.  No rubs thrills or heaves Abdomen: soft, nontender; no hepatosplenomehaly, BS+; abdominal aorta nontender and not dilated by palpation. Back: No CVA tenderness Pulses 2+ Extremities: no clubbing cyanosis or edema, Homan's sign negative  Neurologic: grossly nonfocal Psychological: Normal affect and mood  ECG (independently read by me): Normal sinus rhythm at 62 bpm.  Poor R-wave progression previously noted  T-wave inversion inferolaterally  Prior September 2014 ECG: Sinus rhythm at 57 beats a minute. Previously noted T-wave abnormalities inferolaterally.  LABS:  BMET    Component Value Date/Time   NA 141 05/21/2008 0920   K 3.7 05/21/2008 0920   CL 106 05/21/2008 0920   CO2 29 05/21/2008 0920   GLUCOSE 113* 05/21/2008 0920   BUN 14 05/21/2008 0920   CREATININE 1.10 05/21/2008 0920   CALCIUM 9.1 05/21/2008 0920   GFRNONAA >60 05/21/2008 0920   GFRAA  05/21/2008 0920    >60        The eGFR has been calculated using the MDRD equation. This calculation has not been validated in all clinical situations. eGFR's persistently <60 mL/min signify possible Chronic Kidney Disease.     Hepatic Function Panel     Component Value Date/Time   PROT 6.6 05/21/2008 0920   ALBUMIN 3.8 05/21/2008 0920   AST 23 05/21/2008 0920   ALT 30 05/21/2008 0920   ALKPHOS 69 05/21/2008 0920   BILITOT 0.9 05/21/2008 0920     CBC    Component Value Date/Time   WBC 4.7 05/21/2008 0920  RBC 4.39 05/21/2008 0920   HGB 15.5 05/21/2008 0920   HCT 45.0 05/21/2008 0920   PLT 113* 05/21/2008 0920   MCV 102.6* 05/21/2008 0920   MCHC 34.4 05/21/2008 0920   RDW 14.1 05/21/2008 0920     BNP No results found for: PROBNP  Lipid Panel  No results found for: CHOL   RADIOLOGY: No results found.    ASSESSMENT AND PLAN: Pedro Kent is a 73 year old African-American gentleman who is now 8 years since developing his deep vein thrombosis. He has been on chronic Coumadin therapy ever since and his INR today is therapeutic at 2.7. His blood pressure today is controlledand he is on a 4 drug regimen consisting of valsartan.  3.  Return 20 mg, spironolactone 25 mg, metoprolol titrate 51 g twice a day in addition to amlodipine 10 mg.  He has been on Lipitor 10 mg for hyperlipidemia.  He also is on omeprazole for GERD.  He states he's not had laboratory checked in over a year.  He is fasting today.  I will check  a complete set of laboratory, including a PSA level, chemistry, CBC, lipid studies, thyroid function studies.  I discussed the importance of tobacco cessation.  He continues to have previously diagnosed inferolateral T wave abnormalities.  These have not changed.  He denies any episodes of chest pain. He has documented LVH with grade 1 diastolic dysfunction and abnormal T wave abnormalities. A nuclear perfusion study in 2012 which showed normal perfusion with mild diaphragmatic attenuation. His last echo Doppler study on 09/23/2012 continue to reveal normal LV function with ejection fraction 60-65% with mild LVH, mildly thickened mitral valve without significant regurgitation and only minimal pulmonary hypertension with a slight artery systolic pressure 31 mm. I have recommended he continue his current medical regimen. As long as he remains stable I will see him in one year for followup evaluation.     Troy Sine, MD, [facility]  12/21/2013 10:15 AM

## 2013-12-22 LAB — PSA: PSA: 0.05 ng/mL (ref <=4.00)

## 2013-12-22 NOTE — Telephone Encounter (Signed)
Rx was sent to pharmacy electronically. 

## 2014-01-04 ENCOUNTER — Encounter: Payer: Self-pay | Admitting: *Deleted

## 2014-02-01 ENCOUNTER — Ambulatory Visit (INDEPENDENT_AMBULATORY_CARE_PROVIDER_SITE_OTHER): Payer: Medicare Other | Admitting: Pharmacist Clinician (PhC)/ Clinical Pharmacy Specialist

## 2014-02-01 VITALS — BP 116/70 | HR 64

## 2014-02-01 DIAGNOSIS — Z7901 Long term (current) use of anticoagulants: Secondary | ICD-10-CM

## 2014-02-01 DIAGNOSIS — I82409 Acute embolism and thrombosis of unspecified deep veins of unspecified lower extremity: Secondary | ICD-10-CM

## 2014-02-01 LAB — POCT INR: INR: 3.3

## 2014-02-04 ENCOUNTER — Other Ambulatory Visit: Payer: Self-pay | Admitting: Cardiovascular Disease

## 2014-02-04 ENCOUNTER — Other Ambulatory Visit: Payer: Self-pay | Admitting: Radiation Oncology

## 2014-02-04 NOTE — Telephone Encounter (Signed)
Rx was sent to pharmacy electronically. 

## 2014-02-08 ENCOUNTER — Other Ambulatory Visit: Payer: Self-pay | Admitting: Radiation Oncology

## 2014-02-22 ENCOUNTER — Other Ambulatory Visit: Payer: Self-pay | Admitting: Cardiovascular Disease

## 2014-02-22 NOTE — Telephone Encounter (Signed)
Rx(s) sent to pharmacy electronically.  

## 2014-03-10 ENCOUNTER — Telehealth: Payer: Self-pay | Admitting: Radiation Oncology

## 2014-03-10 NOTE — Telephone Encounter (Signed)
Received fax from [facility] requesting approval for 90 day supply. Tammi Klippel wrote script last on 02/09/2014 with 5 refill thus, this RN approved a 90 supply.

## 2014-03-15 ENCOUNTER — Ambulatory Visit: Payer: Medicare Other | Admitting: Pharmacist Clinician (PhC)/ Clinical Pharmacy Specialist

## 2014-03-16 ENCOUNTER — Ambulatory Visit (INDEPENDENT_AMBULATORY_CARE_PROVIDER_SITE_OTHER): Payer: Medicare Other | Admitting: Pharmacist Clinician (PhC)/ Clinical Pharmacy Specialist

## 2014-03-16 VITALS — BP 120/72 | HR 68

## 2014-03-16 DIAGNOSIS — I82409 Acute embolism and thrombosis of unspecified deep veins of unspecified lower extremity: Secondary | ICD-10-CM

## 2014-03-16 DIAGNOSIS — Z7901 Long term (current) use of anticoagulants: Secondary | ICD-10-CM

## 2014-03-16 LAB — POCT INR: INR: 1.5

## 2014-04-05 ENCOUNTER — Ambulatory Visit (INDEPENDENT_AMBULATORY_CARE_PROVIDER_SITE_OTHER): Payer: Medicare Other | Admitting: Pharmacist Clinician (PhC)/ Clinical Pharmacy Specialist

## 2014-04-05 VITALS — BP 118/70 | HR 64

## 2014-04-05 DIAGNOSIS — I82409 Acute embolism and thrombosis of unspecified deep veins of unspecified lower extremity: Secondary | ICD-10-CM

## 2014-04-05 DIAGNOSIS — Z7901 Long term (current) use of anticoagulants: Secondary | ICD-10-CM

## 2014-04-05 LAB — POCT INR: INR: 2.8

## 2014-04-11 ENCOUNTER — Other Ambulatory Visit: Payer: Self-pay | Admitting: Pharmacist Clinician (PhC)/ Clinical Pharmacy Specialist

## 2014-04-14 ENCOUNTER — Other Ambulatory Visit: Payer: Self-pay | Admitting: Cardiovascular Disease

## 2014-04-14 NOTE — Telephone Encounter (Signed)
Rx(s) sent to pharmacy electronically.  

## 2014-04-22 ENCOUNTER — Other Ambulatory Visit: Payer: Self-pay | Admitting: Cardiovascular Disease

## 2014-04-22 NOTE — Telephone Encounter (Signed)
Valsartan refilled. Atorvastatin refilled 04/14/14 #90 with 2 refills

## 2014-05-03 ENCOUNTER — Ambulatory Visit (INDEPENDENT_AMBULATORY_CARE_PROVIDER_SITE_OTHER): Payer: Medicare Other | Admitting: Pharmacist Clinician (PhC)/ Clinical Pharmacy Specialist

## 2014-05-03 VITALS — BP 140/72 | HR 76

## 2014-05-03 DIAGNOSIS — I82409 Acute embolism and thrombosis of unspecified deep veins of unspecified lower extremity: Secondary | ICD-10-CM

## 2014-05-03 DIAGNOSIS — Z7901 Long term (current) use of anticoagulants: Secondary | ICD-10-CM

## 2014-05-03 LAB — POCT INR: INR: 3

## 2014-06-14 ENCOUNTER — Ambulatory Visit (INDEPENDENT_AMBULATORY_CARE_PROVIDER_SITE_OTHER): Payer: Medicare Other | Admitting: Pharmacist Clinician (PhC)/ Clinical Pharmacy Specialist

## 2014-06-14 DIAGNOSIS — Z7901 Long term (current) use of anticoagulants: Secondary | ICD-10-CM

## 2014-06-14 DIAGNOSIS — I82409 Acute embolism and thrombosis of unspecified deep veins of unspecified lower extremity: Secondary | ICD-10-CM

## 2014-06-14 LAB — POCT INR: INR: 2.7

## 2014-07-28 ENCOUNTER — Ambulatory Visit (INDEPENDENT_AMBULATORY_CARE_PROVIDER_SITE_OTHER): Payer: Medicare Other | Admitting: Pharmacist Clinician (PhC)/ Clinical Pharmacy Specialist

## 2014-07-28 VITALS — BP 112/64 | HR 68

## 2014-07-28 DIAGNOSIS — Z7901 Long term (current) use of anticoagulants: Secondary | ICD-10-CM

## 2014-07-28 DIAGNOSIS — I82409 Acute embolism and thrombosis of unspecified deep veins of unspecified lower extremity: Secondary | ICD-10-CM

## 2014-07-28 LAB — POCT INR: INR: 3.2

## 2014-08-25 ENCOUNTER — Ambulatory Visit (INDEPENDENT_AMBULATORY_CARE_PROVIDER_SITE_OTHER): Payer: Medicare Other | Admitting: Pharmacist Clinician (PhC)/ Clinical Pharmacy Specialist

## 2014-08-25 VITALS — BP 120/68 | HR 72

## 2014-08-25 DIAGNOSIS — I82409 Acute embolism and thrombosis of unspecified deep veins of unspecified lower extremity: Secondary | ICD-10-CM

## 2014-08-25 DIAGNOSIS — Z7901 Long term (current) use of anticoagulants: Secondary | ICD-10-CM | POA: Diagnosis not present

## 2014-08-25 LAB — POCT INR: INR: 3

## 2014-09-09 ENCOUNTER — Other Ambulatory Visit: Payer: Self-pay | Admitting: Radiation Oncology

## 2014-09-22 ENCOUNTER — Ambulatory Visit (INDEPENDENT_AMBULATORY_CARE_PROVIDER_SITE_OTHER): Payer: Medicare Other | Admitting: Pharmacist Clinician (PhC)/ Clinical Pharmacy Specialist

## 2014-09-22 VITALS — BP 114/68 | HR 68

## 2014-09-22 DIAGNOSIS — I82409 Acute embolism and thrombosis of unspecified deep veins of unspecified lower extremity: Secondary | ICD-10-CM

## 2014-09-22 DIAGNOSIS — Z7901 Long term (current) use of anticoagulants: Secondary | ICD-10-CM

## 2014-09-22 LAB — POCT INR: INR: 2.8

## 2014-10-05 ENCOUNTER — Telehealth: Payer: Self-pay | Admitting: Cardiovascular Disease

## 2014-10-05 NOTE — Telephone Encounter (Signed)
Closed encounter °

## 2014-10-31 ENCOUNTER — Other Ambulatory Visit: Payer: Self-pay | Admitting: Cardiovascular Disease

## 2014-11-03 ENCOUNTER — Ambulatory Visit (INDEPENDENT_AMBULATORY_CARE_PROVIDER_SITE_OTHER): Payer: Medicare Other | Admitting: Pharmacist Clinician (PhC)/ Clinical Pharmacy Specialist

## 2014-11-03 VITALS — BP 122/70 | HR 72

## 2014-11-03 DIAGNOSIS — I82409 Acute embolism and thrombosis of unspecified deep veins of unspecified lower extremity: Secondary | ICD-10-CM

## 2014-11-03 DIAGNOSIS — Z7901 Long term (current) use of anticoagulants: Secondary | ICD-10-CM

## 2014-11-03 LAB — POCT INR: INR: 3.8

## 2014-11-24 ENCOUNTER — Ambulatory Visit (INDEPENDENT_AMBULATORY_CARE_PROVIDER_SITE_OTHER): Payer: Medicare Other | Admitting: Pharmacist Clinician (PhC)/ Clinical Pharmacy Specialist

## 2014-11-24 DIAGNOSIS — Z7901 Long term (current) use of anticoagulants: Secondary | ICD-10-CM

## 2014-11-24 DIAGNOSIS — I82409 Acute embolism and thrombosis of unspecified deep veins of unspecified lower extremity: Secondary | ICD-10-CM | POA: Diagnosis not present

## 2014-11-24 LAB — POCT INR: INR: 3.1

## 2014-12-15 ENCOUNTER — Ambulatory Visit (INDEPENDENT_AMBULATORY_CARE_PROVIDER_SITE_OTHER): Payer: Medicare Other | Admitting: Pharmacist Clinician (PhC)/ Clinical Pharmacy Specialist

## 2014-12-15 DIAGNOSIS — Z7901 Long term (current) use of anticoagulants: Secondary | ICD-10-CM | POA: Diagnosis not present

## 2014-12-15 DIAGNOSIS — I82409 Acute embolism and thrombosis of unspecified deep veins of unspecified lower extremity: Secondary | ICD-10-CM

## 2014-12-15 LAB — POCT INR: INR: 3.4

## 2014-12-21 ENCOUNTER — Other Ambulatory Visit: Payer: Self-pay | Admitting: Cardiovascular Disease

## 2014-12-29 ENCOUNTER — Ambulatory Visit (INDEPENDENT_AMBULATORY_CARE_PROVIDER_SITE_OTHER): Payer: Medicare Other | Admitting: Pharmacist Clinician (PhC)/ Clinical Pharmacy Specialist

## 2014-12-29 VITALS — BP 140/64 | HR 68

## 2014-12-29 DIAGNOSIS — I82409 Acute embolism and thrombosis of unspecified deep veins of unspecified lower extremity: Secondary | ICD-10-CM

## 2014-12-29 DIAGNOSIS — Z7901 Long term (current) use of anticoagulants: Secondary | ICD-10-CM

## 2014-12-29 LAB — POCT INR: INR: 2.7

## 2015-01-15 ENCOUNTER — Other Ambulatory Visit: Payer: Self-pay | Admitting: Cardiovascular Disease

## 2015-01-17 NOTE — Telephone Encounter (Signed)
Rx(s) sent to pharmacy electronically.  

## 2015-01-19 ENCOUNTER — Other Ambulatory Visit: Payer: Self-pay | Admitting: Cardiovascular Disease

## 2015-01-20 ENCOUNTER — Encounter: Payer: Self-pay | Admitting: Cardiovascular Disease

## 2015-01-20 ENCOUNTER — Ambulatory Visit (INDEPENDENT_AMBULATORY_CARE_PROVIDER_SITE_OTHER): Payer: Medicare Other | Admitting: Cardiovascular Disease

## 2015-01-20 ENCOUNTER — Ambulatory Visit (INDEPENDENT_AMBULATORY_CARE_PROVIDER_SITE_OTHER): Payer: Medicare Other | Admitting: Pharmacist Clinician (PhC)/ Clinical Pharmacy Specialist

## 2015-01-20 VITALS — BP 124/70 | HR 60 | Ht 72.0 in | Wt 200.0 lb

## 2015-01-20 DIAGNOSIS — Z720 Tobacco use: Secondary | ICD-10-CM

## 2015-01-20 DIAGNOSIS — R5383 Other fatigue: Secondary | ICD-10-CM

## 2015-01-20 DIAGNOSIS — E785 Hyperlipidemia, unspecified: Secondary | ICD-10-CM

## 2015-01-20 DIAGNOSIS — Z7901 Long term (current) use of anticoagulants: Secondary | ICD-10-CM | POA: Diagnosis not present

## 2015-01-20 DIAGNOSIS — I10 Essential (primary) hypertension: Secondary | ICD-10-CM | POA: Diagnosis not present

## 2015-01-20 DIAGNOSIS — I82409 Acute embolism and thrombosis of unspecified deep veins of unspecified lower extremity: Secondary | ICD-10-CM

## 2015-01-20 LAB — COMPREHENSIVE METABOLIC PANEL
ALBUMIN: 4 g/dL (ref 3.6–5.1)
ALK PHOS: 58 U/L (ref 40–115)
ALT: 30 U/L (ref 9–46)
AST: 31 U/L (ref 10–35)
BILIRUBIN TOTAL: 0.9 mg/dL (ref 0.2–1.2)
BUN: 25 mg/dL (ref 7–25)
CALCIUM: 9.3 mg/dL (ref 8.6–10.3)
CO2: 25 mmol/L (ref 20–31)
Chloride: 105 mmol/L (ref 98–110)
Creat: 1.19 mg/dL — ABNORMAL HIGH (ref 0.70–1.18)
GLUCOSE: 84 mg/dL (ref 65–99)
POTASSIUM: 4.6 mmol/L (ref 3.5–5.3)
Sodium: 138 mmol/L (ref 135–146)
Total Protein: 6.8 g/dL (ref 6.1–8.1)

## 2015-01-20 LAB — LIPID PANEL
CHOL/HDL RATIO: 2.8 ratio (ref <=5.0)
CHOLESTEROL: 128 mg/dL (ref 125–200)
HDL: 45 mg/dL (ref >=40)
LDL Cholesterol: 74 mg/dL (ref <130)
TRIGLYCERIDES: 46 mg/dL (ref <150)
VLDL: 9 mg/dL (ref <30)

## 2015-01-20 LAB — CBC
HEMATOCRIT: 41.4 % (ref 39.0–52.0)
HEMOGLOBIN: 14.3 g/dL (ref 13.0–17.0)
MCH: 35 pg — ABNORMAL HIGH (ref 26.0–34.0)
MCHC: 34.5 g/dL (ref 30.0–36.0)
MCV: 101.5 fL — AB (ref 78.0–100.0)
MPV: 10.4 fL (ref 8.6–12.4)
Platelets: 151 10*3/uL (ref 150–400)
RBC: 4.08 MIL/uL — ABNORMAL LOW (ref 4.22–5.81)
RDW: 13.9 % (ref 11.5–15.5)
WBC: 4.9 10*3/uL (ref 4.0–10.5)

## 2015-01-20 LAB — POCT INR: INR: 2.8

## 2015-01-20 LAB — TSH: TSH: 0.932 u[IU]/mL (ref 0.350–4.500)

## 2015-01-20 MED ORDER — METOPROLOL TARTRATE 50 MG PO TABS: 25.0000 mg | ORAL_TABLET | Freq: Two times a day (BID) | ORAL | Status: DC

## 2015-01-20 NOTE — Telephone Encounter (Signed)
REFILL 

## 2015-01-20 NOTE — Patient Instructions (Addendum)
Your physician has recommended you make the following change in your medication: the lopressor has been changed to 1/2 tablet twice a day.  Your physician recommends that you return for lab work in: fasting.  Your physician wants you to follow-up in: 6 months or sooner if needed. You will receive a reminder letter in the mail two months in advance. If you don't receive a letter, please call our office to schedule the follow-up appointment.   If you need a refill on your cardiac medications before your next appointment, please call your pharmacy.

## 2015-01-20 NOTE — Progress Notes (Signed)
Patient ID: Pedro Kent, male   DOB: 12-23-1940, 74 y.o.   MRN: [id number]      HPI: Pedro Kent is a 74 y.o. male who presents for a one-year follow-up cardiology evaluation.  Pedro Kent developed a  deep vein thrombosis of his left lower extremity in January 2007and has been on chronic anticoagulation therapy with Coumadin ever since. Additional problems include hypertension with adrenal hyperplasia, history of prostate CA followed with history of radiation seeds, history of hyperlipidemia, and an abnormal ECG with T wave inversion. An echo Doppler study in January 2011  showed mild concentric LVH, mild MR with possible torn redundant chordae tendinea, mild aortic valve sclerosis, and mild coronary insufficiency.   On 09/23/2012 an echo Doppler study showed an ejection fraction at 60-65%. Pedro Kent had mild left ventricular hypertrophy and had normal diastolic parameters on this present study. There is very minimal pulmonary pressure elevation at 31 mm. Is mitral valve is mildly thickened with no significant regurgitation.  Over the past year, Pedro Kent feels that Pedro Kent is done fairly well.   Pedro Kent denies any episodes of chest pain or significant shortness of breath.  However, his chief complaint is that of decreased energy and more fatigability.  Pedro Kent admits to sleeping well and denies any daytime sleepiness. Pedro Kent is on a 4 drug regimen for hypertension, consisting of amlodipine 10 mg, metoprolol 50 mg twice a day, spironolactone 25 mg daily and valsartan 320 mg.  Pedro Kent is on Prilosec for GERD.  Pedro Kent is taking atorvastatin 10 mg for .  Pedro Kent continues to smoke small cigars.  Pedro Kent has not had any recent laboratory.  Hepresents for one-year evaluation.    Past Medical History  Diagnosis Date  . Hypertension   . Cancer [facility]) 2010    prostate cancer  . Hyperlipidemia   . Adrenal hyperplasia [facility])     Past Surgical History  Procedure Laterality Date  . 2-d echocardiogram  03/10/2009    Normal left ventricular  systolic function. Mild MR. Normal RV systolic pressure. Mild pulmonic valvular regurgitation.  . Cardiac stress test  04/05/2010    Negative for ischemia    Allergies  Allergen Reactions  . Ace Inhibitors     Current Outpatient Prescriptions  Medication Sig Dispense Refill  . amLODipine (NORVASC) 10 MG tablet TAKE 1 TABLET (10 MG TOTAL) BY MOUTH DAILY. 90 tablet 0  . aspirin EC 81 MG tablet Take 81 mg by mouth daily.    Nyoka Cowden Tea, Camillia sinensis, (GREEN TEA PO) Take by mouth. Cup of Tea daily.    . metoprolol (LOPRESSOR) 50 MG tablet Take 0.5 tablets (25 mg total) by mouth 2 (two) times daily. 180 tablet 3  . Misc Natural Products (MENS PROSTATE HEALTH FORMULA PO) Take 4 capsules by mouth daily.    . Multiple Vitamins-Minerals (MULTIVITAMIN) tablet Take 1 tablet by mouth daily.    . NON FORMULARY Spoon full of Flax Seeds daily.    . NON FORMULARY Blue Berry Extract daily    . omeprazole (PRILOSEC) 20 MG capsule Take 20 mg by mouth daily.    Marland Kitchen spironolactone (ALDACTONE) 25 MG tablet Take 1 tablet (25 mg total) by mouth daily. 90 tablet 3  . tamsulosin (FLOMAX) 0.4 MG CAPS capsule TAKE 1 CAPSULE TWICE A DAY 180 capsule 1  . valsartan (DIOVAN) 320 MG tablet TAKE 1 TABLET (320 MG TOTAL) BY MOUTH DAILY. 90 tablet 2  . warfarin (COUMADIN) 5 MG tablet TAKE 1 & 1/2 TO 2  TABLETS BY MOUTH DAILY AS DIRECTED 150 tablet 1  . atorvastatin (LIPITOR) 10 MG tablet TAKE 1 TABLET (10 MG TOTAL) BY MOUTH DAILY. 90 tablet 2   No current facility-administered medications for this visit.    Social History   Social History  . Marital Status: Divorced    Spouse Name: N/A  . Number of Children: N/A  . Years of Education: N/A   Occupational History  . Not on file.   Social History Main Topics  . Smoking status: Current Every Day Smoker -- 0.25 packs/day    Types: Cigars  . Smokeless tobacco: Not on file  . Alcohol Use: Yes     Comment: rarely  . Drug Use: Not on file  . Sexual Activity: Not  on file   Other Topics Concern  . Not on file   Social History Narrative   Socially Pedro Kent has 2 children one grandchild. Pedro Kent does walk. Pedro Kent denies recent alcohol use.  No family history on file.   ROS General: Negative; No fevers, chills, or night sweats;  HEENT: Negative; No changes in vision or hearing, sinus congestion, difficulty swallowing Pulmonary: Negative; No cough, wheezing, shortness of breath, hemoptysis Cardiovascular: Negative; No chest pain, presyncope, syncope, palpitations GI: positive for GERD on omeprazole No nausea, vomiting, diarrhea, or abdominal pain GU: Negative; No dysuria, hematuria, or difficulty voiding Musculoskeletal: Negative; no myalgias, joint pain, or weakness Hematologic/Oncology: Negative; no easy bruising, bleeding Endocrine: Negative; no heat/cold intolerance; no diabetes Neuro: Negative; no changes in balance, headaches Skin: Negative; No rashes or skin lesions Psychiatric: Negative; No behavioral problems, depression Sleep: Negative; No snoring, daytime sleepiness, hypersomnolence, bruxism, restless legs, hypnogognic hallucinations, no cataplexy Other comprehensive 14 point system review is negative.    PE BP 124/70 mmHg  Pulse 60  Ht 6' (1.829 m)  Wt 200 lb (90.719 kg)  BMI 27.12 kg/m2   Wt Readings from Last 3 Encounters:  01/20/15 200 lb (90.719 kg)  12/21/13 218 lb 6.4 oz (99.066 kg)  10/27/12 217 lb 4.8 oz (98.567 kg)   General: Alert, oriented, no distress.  Skin: normal turgor, no rashes HEENT: Normocephalic, atraumatic. Pupils round and reactive; sclera anicteric;no lid lag.  Nose without nasal septal hypertrophy Mouth/Parynx benign; Mallinpatti scale 3 Neck: No JVD, no carotid bruits with normal carotid upstroke Lungs: clear to ausculatation and percussion; no wheezing or rales Chest wall: Nontender to palpation Heart: RRR, s1 s2 normal 1/6 systolic murmur, unchanged; .  No diastolic murmur.  No rubs thrills or  heaves Abdomen: soft, nontender; no hepatosplenomehaly, BS+; abdominal aorta nontender and not dilated by palpation. Back: No CVA tenderness Pulses 2+ Extremities: no clubbing cyanosis or edema, Homan's sign negative  Neurologic: grossly nonfocal Psychological: Normal affect and mood  ECG (independently read by me):  Normal sinus rhythm with an occasional PAC. Previously noted inferolateral T wave inversion  November 2015ECG (independently read by me): Normal sinus rhythm at 62 bpm.  Poor R-wave progression previously noted T-wave inversion inferolaterally  Prior September 2014 ECG: Sinus rhythm at 57 beats a minute. Previously noted T-wave abnormalities inferolaterally.  LABS: BMP Latest Ref Rng 12/21/2013 05/21/2008  Glucose 70 - 99 mg/dL 101(H) 113(H)  BUN 6 - 23 mg/dL 14 14  Creatinine 0.50 - 1.35 mg/dL 1.11 1.10  Sodium 135 - 145 mEq/L 139 141  Potassium 3.5 - 5.3 mEq/L 4.5 3.7  Chloride 96 - 112 mEq/L 105 106  CO2 19 - 32 mEq/L 27 29  Calcium 8.4 - 10.5 mg/dL 9.0  9.1   Hepatic Function Latest Ref Rng 12/21/2013 05/21/2008  Total Protein 6.0 - 8.3 g/dL 6.6 6.6  Albumin 3.5 - 5.2 g/dL 4.4 3.8  AST 0 - 37 U/L 24 23  ALT 0 - 53 U/L 32 30  Alk Phosphatase 39 - 117 U/L 59 69  Total Bilirubin 0.2 - 1.2 mg/dL 0.7 0.9   CBC Latest Ref Rng 12/21/2013 05/21/2008  WBC 4.0 - 10.5 K/uL 4.0 4.7  Hemoglobin 13.0 - 17.0 g/dL 15.0 15.5  Hematocrit 39.0 - 52.0 % 43.1 45.0  Platelets 150 - 400 K/uL 147(L) 113(L)   Lab Results  Component Value Date   MCV 100.7* 12/21/2013   MCV 102.6* 05/21/2008   Lab Results  Component Value Date   TSH 0.879 12/21/2013  No results found for: HGBA1C   Lipid Panel     Component Value Date/Time   CHOL 132 12/21/2013 1112   TRIG 88 12/21/2013 1112   HDL 44 12/21/2013 1112   CHOLHDL 3.0 12/21/2013 1112   VLDL 18 12/21/2013 1112   LDLCALC 70 12/21/2013 1112     ASSESSMENT AND PLAN: Pedro Kent is a 74 year old African-American gentleman who is 9 years  since developing his deep vein thrombosis. Pedro Kent has been on chronic Coumadin therapy ever since and has tolerated this without bleeding issues. His blood pressure today is controlled and Pedro Kent is on a 4 drug regimen consisting of valsartan 320 mg, spironolactone 25 mg, metoprolol titrate 50 mg twice a day in addition to amlodipine 10 mg.  Pedro Kent has been on Lipitor 10 mg for hyperlipidemia.  Pedro Kent also is on omeprazole for GERD.   Pedro Kent has noted some decreased energy.Marland Kitchen  His resting pulse is 60.  As long as his blood pressure remained stable, I suggested a trial of reducing his metoprolol to 25 mg twice a day.  Pedro Kent will monitor his blood pressure to make certain this does not increase and will also make certain Pedro Kent is not developed any palpitations.  Pedro Kent is not had recent blood work.  A complete set of fasting laboratory will be obtained.  I will contact Pedro Kent regarding the results if changes need to be made to his medical therapy.  His long as she remains stable, I will see Pedro Kent in 6 months for reevaluation.   Ti me spent: 25 minutes  Troy Sine, MD, [facility]  01/20/2015 6:32 PM

## 2015-01-24 ENCOUNTER — Other Ambulatory Visit: Payer: Self-pay | Admitting: Cardiovascular Disease

## 2015-01-25 NOTE — Telephone Encounter (Signed)
Rx(s) sent to pharmacy electronically.  

## 2015-02-01 ENCOUNTER — Encounter: Payer: Self-pay | Admitting: *Deleted

## 2015-02-06 ENCOUNTER — Other Ambulatory Visit: Payer: Self-pay | Admitting: Cardiovascular Disease

## 2015-02-07 NOTE — Telephone Encounter (Signed)
Rx(s) sent to pharmacy electronically.  

## 2015-03-04 ENCOUNTER — Ambulatory Visit (INDEPENDENT_AMBULATORY_CARE_PROVIDER_SITE_OTHER): Payer: Medicare Other | Admitting: Pharmacist Clinician (PhC)/ Clinical Pharmacy Specialist

## 2015-03-04 ENCOUNTER — Other Ambulatory Visit: Payer: Self-pay | Admitting: Cardiovascular Disease

## 2015-03-04 DIAGNOSIS — I82409 Acute embolism and thrombosis of unspecified deep veins of unspecified lower extremity: Secondary | ICD-10-CM | POA: Diagnosis not present

## 2015-03-04 DIAGNOSIS — Z7901 Long term (current) use of anticoagulants: Secondary | ICD-10-CM

## 2015-03-04 LAB — POCT INR: INR: 3

## 2015-03-08 ENCOUNTER — Telehealth: Payer: Self-pay | Admitting: Radiation Oncology

## 2015-03-08 ENCOUNTER — Other Ambulatory Visit: Payer: Self-pay | Admitting: Cardiovascular Disease

## 2015-03-08 MED ORDER — AMLODIPINE BESYLATE 10 MG PO TABS: ORAL_TABLET | ORAL | Status: DC

## 2015-03-08 NOTE — Telephone Encounter (Signed)
RX refilled  

## 2015-03-08 NOTE — Telephone Encounter (Signed)
Received message from [facility] requesting a return call reference this patient. Called back and spoke with [facility], pharmacist. She reports the [facility]. She requested tamsulosin script. Provided verbal order for tamsulin 0.4 mg bid, 90 day supply, and 5 refills as order by Dr. Tammi Klippel.

## 2015-03-08 NOTE — Telephone Encounter (Signed)
°*  STAT* If patient is at the pharmacy, call can be transferred to refill team.   1. Which medications need to be refilled? (please list name of each medication and dose if known) Amlodipine  2. Which pharmacy/location (including street and city if local pharmacy) is medication to be sent [id number] RX  3. Do they need a 30 day or 90 day supply?90 and refills

## 2015-03-10 ENCOUNTER — Other Ambulatory Visit: Payer: Self-pay | Admitting: Radiation Oncology

## 2015-03-15 ENCOUNTER — Encounter: Payer: Self-pay | Admitting: Pharmacist Clinician (PhC)/ Clinical Pharmacy Specialist

## 2015-03-28 ENCOUNTER — Other Ambulatory Visit: Payer: Self-pay | Admitting: Cardiovascular Disease

## 2015-03-28 MED ORDER — AMLODIPINE BESYLATE 10 MG PO TABS: ORAL_TABLET | ORAL | Status: DC

## 2015-03-29 ENCOUNTER — Encounter: Payer: Self-pay | Admitting: *Deleted

## 2015-03-29 NOTE — Telephone Encounter (Signed)
This encounter was created in error - please disregard.

## 2015-04-15 ENCOUNTER — Encounter: Payer: Medicare Other | Admitting: Pharmacist Clinician (PhC)/ Clinical Pharmacy Specialist

## 2015-04-16 ENCOUNTER — Other Ambulatory Visit: Payer: Self-pay | Admitting: Cardiovascular Disease

## 2015-04-18 ENCOUNTER — Ambulatory Visit (INDEPENDENT_AMBULATORY_CARE_PROVIDER_SITE_OTHER): Payer: Medicare Other | Admitting: Pharmacist Clinician (PhC)/ Clinical Pharmacy Specialist

## 2015-04-18 DIAGNOSIS — I82409 Acute embolism and thrombosis of unspecified deep veins of unspecified lower extremity: Secondary | ICD-10-CM

## 2015-04-18 DIAGNOSIS — Z7901 Long term (current) use of anticoagulants: Secondary | ICD-10-CM

## 2015-04-18 LAB — POCT INR: INR: 2.8

## 2015-04-18 NOTE — Telephone Encounter (Signed)
R

## 2015-04-19 ENCOUNTER — Other Ambulatory Visit: Payer: Self-pay | Admitting: Cardiovascular Disease

## 2015-05-30 ENCOUNTER — Ambulatory Visit (INDEPENDENT_AMBULATORY_CARE_PROVIDER_SITE_OTHER): Payer: Medicare Other | Admitting: Pharmacist Clinician (PhC)/ Clinical Pharmacy Specialist

## 2015-05-30 DIAGNOSIS — Z7901 Long term (current) use of anticoagulants: Secondary | ICD-10-CM | POA: Diagnosis not present

## 2015-05-30 DIAGNOSIS — I82409 Acute embolism and thrombosis of unspecified deep veins of unspecified lower extremity: Secondary | ICD-10-CM

## 2015-05-30 LAB — POCT INR: INR: 2.2

## 2015-06-01 ENCOUNTER — Other Ambulatory Visit: Payer: Self-pay | Admitting: *Deleted

## 2015-06-01 MED ORDER — WARFARIN SODIUM 5 MG PO TABS: ORAL_TABLET | ORAL | Status: DC

## 2015-06-08 ENCOUNTER — Other Ambulatory Visit: Payer: Self-pay | Admitting: Cardiovascular Disease

## 2015-06-08 NOTE — Telephone Encounter (Signed)
Rx request sent to pharmacy.  

## 2015-06-28 ENCOUNTER — Encounter: Payer: Self-pay | Admitting: Cardiovascular Disease

## 2015-06-28 ENCOUNTER — Ambulatory Visit (INDEPENDENT_AMBULATORY_CARE_PROVIDER_SITE_OTHER): Payer: Medicare Other | Admitting: Cardiovascular Disease

## 2015-06-28 VITALS — BP 137/77 | HR 71 | Ht 72.0 in | Wt 201.6 lb

## 2015-06-28 DIAGNOSIS — E611 Iron deficiency: Secondary | ICD-10-CM

## 2015-06-28 DIAGNOSIS — E785 Hyperlipidemia, unspecified: Secondary | ICD-10-CM

## 2015-06-28 DIAGNOSIS — F419 Anxiety disorder, unspecified: Secondary | ICD-10-CM

## 2015-06-28 DIAGNOSIS — Z720 Tobacco use: Secondary | ICD-10-CM | POA: Diagnosis not present

## 2015-06-28 DIAGNOSIS — I82409 Acute embolism and thrombosis of unspecified deep veins of unspecified lower extremity: Secondary | ICD-10-CM

## 2015-06-28 DIAGNOSIS — I10 Essential (primary) hypertension: Secondary | ICD-10-CM

## 2015-06-28 DIAGNOSIS — R9431 Abnormal electrocardiogram [ECG] [EKG]: Secondary | ICD-10-CM

## 2015-06-28 DIAGNOSIS — Z7901 Long term (current) use of anticoagulants: Secondary | ICD-10-CM

## 2015-06-28 DIAGNOSIS — D509 Iron deficiency anemia, unspecified: Secondary | ICD-10-CM | POA: Diagnosis not present

## 2015-06-28 DIAGNOSIS — Z8546 Personal history of malignant neoplasm of prostate: Secondary | ICD-10-CM

## 2015-06-28 LAB — CBC
HCT: 45.3 % (ref 38.5–50.0)
Hemoglobin: 15.2 g/dL (ref 13.2–17.1)
MCH: 34.5 pg — AB (ref 27.0–33.0)
MCHC: 33.6 g/dL (ref 32.0–36.0)
MCV: 103 fL — AB (ref 80.0–100.0)
MPV: 9.8 fL (ref 7.5–12.5)
PLATELETS: 156 10*3/uL (ref 140–400)
RBC: 4.4 MIL/uL (ref 4.20–5.80)
RDW: 14.2 % (ref 11.0–15.0)
WBC: 5 10*3/uL (ref 3.8–10.8)

## 2015-06-28 MED ORDER — METOPROLOL TARTRATE 50 MG PO TABS: ORAL_TABLET | ORAL | Status: DC

## 2015-06-28 NOTE — Patient Instructions (Signed)
Your physician has recommended you make the following change in your medication:   The lopressor has been changed to 50 mg ( 1 tablet) in the morning and 25 mg at night ( 1/2 tablet)  Your physician recommends that you return for lab work.  Your physician wants you to follow-up in: 6 months or sooner if needed. You will receive a reminder letter in the mail two months in advance. If you don't receive a letter, please call our office to schedule the follow-up appointment.

## 2015-06-29 LAB — FOLATE: Folate: 17.6 ng/mL (ref >5.4)

## 2015-06-29 LAB — BASIC METABOLIC PANEL
BUN: 18 mg/dL (ref 7–25)
CALCIUM: 9.4 mg/dL (ref 8.6–10.3)
CO2: 24 mmol/L (ref 20–31)
CREATININE: 1.11 mg/dL (ref 0.70–1.18)
Chloride: 108 mmol/L (ref 98–110)
Glucose, Bld: 86 mg/dL (ref 65–99)
Potassium: 4.4 mmol/L (ref 3.5–5.3)
SODIUM: 141 mmol/L (ref 135–146)

## 2015-06-29 LAB — VITAMIN B12: VITAMIN B 12: 660 pg/mL (ref 200–1100)

## 2015-06-30 ENCOUNTER — Other Ambulatory Visit: Payer: Self-pay

## 2015-06-30 ENCOUNTER — Encounter: Payer: Self-pay | Admitting: Cardiovascular Disease

## 2015-06-30 DIAGNOSIS — F419 Anxiety disorder, unspecified: Secondary | ICD-10-CM | POA: Insufficient documentation

## 2015-06-30 MED ORDER — METOPROLOL TARTRATE 50 MG PO TABS: ORAL_TABLET | ORAL | Status: DC

## 2015-06-30 NOTE — Progress Notes (Signed)
Patient ID: Pedro Kent, male   DOB: 1940-07-30, 75 y.o.   MRN: [id number]    Primary MD: Dr. Tommy Kent  HPI: Pedro Kent is a 75 y.o. male who presents for a 6 month follow-up cardiology evaluation.  Pedro Kent developed a  deep vein thrombosis of his left lower extremity in January 2007and has been on chronic anticoagulation therapy with Coumadin ever since. Additional problems include hypertension with adrenal hyperplasia, history of prostate CA treated with history of radiation seeds,  hyperlipidemia, and an abnormal ECG with T wave inversion. An echo Doppler study in January 2011  showed mild concentric LVH, mild MR with possible torn redundant chordae tendinea, mild aortic valve sclerosis, and mild coronary insufficiency.   On 09/23/2012 an echo Doppler study showed an ejection fraction at 60-65%. He had mild left ventricular hypertrophy and had normal diastolic parameters on this present study. There is very minimal pulmonary pressure elevation at 31 mm. Is mitral valve is mildly thickened with no significant regurgitation.   He is on a 4 drug regimen for hypertension, consisting of amlodipine 10 mg, metoprolol 25 mg twice a day, spironolactone 25 mg daily and valsartan 320 mg.  He is on Prilosec for GERD.  He is taking atorvastatin 10 mg for hyperlipidemia.  He is on chronic warfarin therapy with his clot history.  Last year he had noted fatigability and his dose of metoprolol had been reduced.  Recently, he admits to increasing anxiety, particular when he's driving.  He denies any frank panic attacks.  Recent lab work was reviewed.  Hemoglobin and hematocrit were stable but MCV was increased at 101.5.  He denies any episodes of chest pain.  He denies presyncope or syncope.  He denies any recurrent DVT.  He continues to smoke small cigars.  Past Medical History  Diagnosis Date  . Hypertension   . Cancer [facility]) 2010    prostate cancer  . Hyperlipidemia   . Adrenal hyperplasia  [facility])     Past Surgical History  Procedure Laterality Date  . 2-d echocardiogram  03/10/2009    Normal left ventricular systolic function. Mild MR. Normal RV systolic pressure. Mild pulmonic valvular regurgitation.  . Cardiac stress test  04/05/2010    Negative for ischemia    Allergies  Allergen Reactions  . Ace Inhibitors     Current Outpatient Prescriptions  Medication Sig Dispense Refill  . amLODipine (NORVASC) 10 MG tablet TAKE ONE TABLET BY MOUTH EVERY DAY 90 tablet 1  . aspirin EC 81 MG tablet Take 81 mg by mouth daily.    Marland Kitchen atorvastatin (LIPITOR) 10 MG tablet TAKE 1 TABLET (10 MG TOTAL) BY MOUTH DAILY. 90 tablet 2  . Green Tea, Camillia sinensis, (GREEN TEA PO) Take by mouth. Cup of Tea daily.    . Misc Natural Products (MENS PROSTATE HEALTH FORMULA PO) Take 4 capsules by mouth daily.    . Multiple Vitamins-Minerals (MULTIVITAMIN) tablet Take 1 tablet by mouth daily.    . NON FORMULARY Spoon full of Flax Seeds daily.    . NON FORMULARY Blue Berry Extract daily    . omeprazole (PRILOSEC) 20 MG capsule Take 20 mg by mouth daily.    Marland Kitchen spironolactone (ALDACTONE) 25 MG tablet TAKE 1 TABLET (25 MG TOTAL) BY MOUTH DAILY. 90 tablet 3  . tamsulosin (FLOMAX) 0.4 MG CAPS capsule TAKE 1 CAPSULE TWICE A DAY 180 capsule 1  . valsartan (DIOVAN) 320 MG tablet TAKE 1 TABLET (320 MG TOTAL) BY MOUTH  DAILY. 90 tablet 2  . warfarin (COUMADIN) 5 MG tablet Take as directed by coumadin clinic 150 tablet 1  . metoprolol (LOPRESSOR) 50 MG tablet Take 1 tablet in the morning and 1/2 tablet at night 45 tablet 6   No current facility-administered medications for this visit.    Social History   Social History  . Marital Status: Divorced    Spouse Name: N/A  . Number of Children: N/A  . Years of Education: N/A   Occupational History  . Not on file.   Social History Main Topics  . Smoking status: Current Every Day Smoker -- 0.25 packs/day    Types: Cigars  . Smokeless tobacco: Not on file    . Alcohol Use: Yes     Comment: rarely  . Drug Use: Not on file  . Sexual Activity: Not on file   Other Topics Concern  . Not on file   Social History Narrative   Socially he has 2 children one grandchild. He does walk. He denies recent alcohol use.  No family history on file.   ROS General: Negative; No fevers, chills, or night sweats;  HEENT: Negative; No changes in vision or hearing, sinus congestion, difficulty swallowing Pulmonary: Negative; No cough, wheezing, shortness of breath, hemoptysis Cardiovascular: Negative; No chest pain, presyncope, syncope, palpitations GI: positive for GERD on omeprazole No nausea, vomiting, diarrhea, or abdominal pain GU: Negative; No dysuria, hematuria, or difficulty voiding Musculoskeletal: Negative; no myalgias, joint pain, or weakness Hematologic/Oncology: Negative; no easy bruising, bleeding Endocrine: Negative; no heat/cold intolerance; no diabetes Neuro: Negative; no changes in balance, headaches Skin: Negative; No rashes or skin lesions Psychiatric: Negative; No behavioral problems, depression Sleep: Negative; No snoring, daytime sleepiness, hypersomnolence, bruxism, restless legs, hypnogognic hallucinations, no cataplexy Other comprehensive 14 point system review is negative.    PE BP 137/77 mmHg  Pulse 71  Ht 6' (1.829 m)  Wt 201 lb 9.6 oz (91.445 kg)  BMI 27.34 kg/m2   Wt Readings from Last 3 Encounters:  06/28/15 201 lb 9.6 oz (91.445 kg)  01/20/15 200 lb (90.719 kg)  12/21/13 218 lb 6.4 oz (99.066 kg)   General: Alert, oriented, no distress.  Skin: normal turgor, no rashes HEENT: Normocephalic, atraumatic. Pupils round and reactive; sclera anicteric;no lid lag.  Nose without nasal septal hypertrophy Mouth/Parynx benign; Mallinpatti scale 3 Neck: No JVD, no carotid bruits with normal carotid upstroke Lungs: clear to ausculatation and percussion; no wheezing or rales Chest wall: Nontender to palpation Heart: RRR, s1  s2 normal 1/6 systolic murmur, unchanged; .  No diastolic murmur.  No rubs thrills or heaves Abdomen: soft, nontender; no hepatosplenomehaly, BS+; abdominal aorta nontender and not dilated by palpation. Back: No CVA tenderness Pulses 2+ Extremities: no clubbing cyanosis or edema, Homan's sign negative  Neurologic: grossly nonfocal Psychological: Normal affect and mood  ECG (independently read by me): Normal sinus rhythm with occasional PACs.  Previously noted inferolateral T wave abnormality.  QTc interval 410 ms.  December 2016 ECG (independently read by me):  Normal sinus rhythm with an occasional PAC. Previously noted inferolateral T wave inversion  November 2015 ECG (independently read by me): Normal sinus rhythm at 62 bpm.  Poor R-wave progression previously noted T-wave inversion inferolaterally  Prior September 2014 ECG: Sinus rhythm at 57 beats a minute. Previously noted T-wave abnormalities inferolaterally.  LABS: BMP Latest Ref Rng 06/28/2015 01/20/2015 12/21/2013  Glucose 65 - 99 mg/dL 86 84 101(H)  BUN 7 - 25 mg/dL 18 25 14  Creatinine 0.70 - 1.18 mg/dL 1.11 1.19(H) 1.11  Sodium 135 - 146 mmol/L 141 138 139  Potassium 3.5 - 5.3 mmol/L 4.4 4.6 4.5  Chloride 98 - 110 mmol/L 108 105 105  CO2 20 - 31 mmol/L _0 Calcium 8.6 - 10.3 mg/dL 9.4 9.3 9.0   Hepatic Function Latest Ref Rng 01/20/2015 12/21/2013 05/21/2008  Total Protein 6.1 - 8.1 g/dL 6.8 6.6 6.6  Albumin 3.6 - 5.1 g/dL 4.0 4.4 3.8  AST 10 - 35 U/L _1 ALT 9 - 46 U/L 30 32 30  Alk Phosphatase 40 - 115 U/L 58 59 69  Total Bilirubin 0.2 - 1.2 mg/dL 0.9 0.7 0.9   CBC Latest Ref Rng 06/28/2015 01/20/2015 12/21/2013  WBC 3.8 - 10.8 K/uL 5.0 4.9 4.0  Hemoglobin 13.2 - 17.1 g/dL 15.2 14.3 15.0  Hematocrit 38.5 - 50.0 % 45.3 41.4 43.1  Platelets 140 - 400 K/uL 156 151 147(L)   Lab Results  Component Value Date   MCV 103.0* 06/28/2015   MCV 101.5* 01/20/2015   MCV 100.7* 12/21/2013   Lab Results  Component Value  Date   TSH 0.932 01/20/2015  No results found for: HGBA1C   Lipid Panel     Component Value Date/Time   CHOL 128 01/20/2015 1210   TRIG 46 01/20/2015 1210   HDL 45 01/20/2015 1210   CHOLHDL 2.8 01/20/2015 1210   VLDL 9 01/20/2015 1210   LDLCALC 74 01/20/2015 1210     ASSESSMENT AND PLAN: Pedro Kent is a 75 year old African-American gentleman who is 10 years since developing his deep vein thrombosis. He has been on chronic Coumadin therapy ever since and has tolerated this without bleeding issues. His blood pressure today is controlled and he is on a 4 drug regimen consisting of valsartan 320 mg, spironolactone 25 mg, metoprolol titrate 25 mg twice a day in addition to amlodipine 10 mg. when he was seen last year, his resting pulse was 60.  He complained of significant fatigability.  At that time his metoprolol dose was reduced from 50 twice a day to 25 mm twice a day.  He has noticed increased anxiety slightly on this reduced metoprolol dose.  His resting pulse is now 71.  He continues to have T-wave changes inferolaterally which are not new.  I will slightly adjust his metoprolol and he will take this now at 50 mg morning and 25 mg in the evening.  He has macrocytic indices and I am recommending a B12 and folate level be checked.  He will also have a follow-up BMet and CBC.  He is not having any bleeding on Coumadin.  He is tolerating atorvastatin for hyperlipidemia and his LDL in December 2016 was 74.  He has been on Lipitor 10 mg for hyperlipidemia.  He also is on omeprazole for GERD.  As long as she remains stable, I will see him in 6 months for reevaluation.  Ti me spent: 25 minutes  Troy Sine, MD, [facility]  06/30/2015 6:21 PM

## 2015-07-11 ENCOUNTER — Ambulatory Visit (INDEPENDENT_AMBULATORY_CARE_PROVIDER_SITE_OTHER): Payer: Medicare Other | Admitting: Pharmacist

## 2015-07-11 DIAGNOSIS — Z7901 Long term (current) use of anticoagulants: Secondary | ICD-10-CM

## 2015-07-11 DIAGNOSIS — I82409 Acute embolism and thrombosis of unspecified deep veins of unspecified lower extremity: Secondary | ICD-10-CM

## 2015-07-11 LAB — POCT INR: INR: 2.6

## 2015-08-04 ENCOUNTER — Encounter: Payer: Self-pay | Admitting: *Deleted

## 2015-08-22 ENCOUNTER — Ambulatory Visit (INDEPENDENT_AMBULATORY_CARE_PROVIDER_SITE_OTHER): Payer: Medicare Other | Admitting: Pharmacist

## 2015-08-22 DIAGNOSIS — Z7901 Long term (current) use of anticoagulants: Secondary | ICD-10-CM | POA: Diagnosis not present

## 2015-08-22 DIAGNOSIS — I82409 Acute embolism and thrombosis of unspecified deep veins of unspecified lower extremity: Secondary | ICD-10-CM

## 2015-08-22 LAB — POCT INR: INR: 2.8

## 2015-09-05 ENCOUNTER — Other Ambulatory Visit: Payer: Self-pay | Admitting: Cardiovascular Disease

## 2015-09-12 ENCOUNTER — Other Ambulatory Visit: Payer: Self-pay | Admitting: Cardiovascular Disease

## 2015-09-21 DIAGNOSIS — H2513 Age-related nuclear cataract, bilateral: Secondary | ICD-10-CM | POA: Diagnosis not present

## 2015-09-21 DIAGNOSIS — H40033 Anatomical narrow angle, bilateral: Secondary | ICD-10-CM | POA: Diagnosis not present

## 2015-10-03 ENCOUNTER — Ambulatory Visit (INDEPENDENT_AMBULATORY_CARE_PROVIDER_SITE_OTHER): Payer: Medicare Other | Admitting: Pharmacist

## 2015-10-03 DIAGNOSIS — Z7901 Long term (current) use of anticoagulants: Secondary | ICD-10-CM

## 2015-10-03 DIAGNOSIS — I82409 Acute embolism and thrombosis of unspecified deep veins of unspecified lower extremity: Secondary | ICD-10-CM

## 2015-10-03 LAB — POCT INR: INR: 2.6

## 2015-10-17 ENCOUNTER — Other Ambulatory Visit: Payer: Self-pay | Admitting: Cardiovascular Disease

## 2015-10-17 NOTE — Telephone Encounter (Signed)
Rx(s) sent to pharmacy electronically.  

## 2015-10-25 ENCOUNTER — Other Ambulatory Visit: Payer: Self-pay | Admitting: Cardiovascular Disease

## 2015-10-25 NOTE — Telephone Encounter (Signed)
Rx(s) sent to pharmacy electronically.  

## 2015-11-14 ENCOUNTER — Ambulatory Visit (INDEPENDENT_AMBULATORY_CARE_PROVIDER_SITE_OTHER): Payer: Medicare Other | Admitting: Pharmacist

## 2015-11-14 DIAGNOSIS — I82409 Acute embolism and thrombosis of unspecified deep veins of unspecified lower extremity: Secondary | ICD-10-CM

## 2015-11-14 DIAGNOSIS — Z7901 Long term (current) use of anticoagulants: Secondary | ICD-10-CM | POA: Diagnosis not present

## 2015-11-14 LAB — POCT INR: INR: 2.1

## 2015-12-19 ENCOUNTER — Other Ambulatory Visit: Payer: Self-pay | Admitting: Cardiovascular Disease

## 2015-12-20 NOTE — Telephone Encounter (Signed)
Rx(s) sent to pharmacy electronically.  

## 2015-12-26 ENCOUNTER — Ambulatory Visit (INDEPENDENT_AMBULATORY_CARE_PROVIDER_SITE_OTHER): Payer: Medicare Other | Admitting: Pharmacist Clinician (PhC)/ Clinical Pharmacy Specialist

## 2015-12-26 DIAGNOSIS — Z7901 Long term (current) use of anticoagulants: Secondary | ICD-10-CM

## 2015-12-26 LAB — POCT INR: INR: 2.8

## 2016-02-06 ENCOUNTER — Ambulatory Visit (INDEPENDENT_AMBULATORY_CARE_PROVIDER_SITE_OTHER): Payer: Medicare Other | Admitting: Pharmacist Clinician (PhC)/ Clinical Pharmacy Specialist

## 2016-02-06 DIAGNOSIS — Z7901 Long term (current) use of anticoagulants: Secondary | ICD-10-CM | POA: Diagnosis not present

## 2016-02-06 LAB — POCT INR: INR: 2.3

## 2016-02-14 ENCOUNTER — Other Ambulatory Visit: Payer: Self-pay | Admitting: Cardiovascular Disease

## 2016-02-14 NOTE — Telephone Encounter (Signed)
REFILL 

## 2016-03-01 ENCOUNTER — Other Ambulatory Visit: Payer: Self-pay | Admitting: *Deleted

## 2016-03-01 MED ORDER — SPIRONOLACTONE 25 MG PO TABS: ORAL_TABLET | ORAL | 0 refills | Status: DC

## 2016-03-03 ENCOUNTER — Other Ambulatory Visit: Payer: Self-pay | Admitting: Cardiovascular Disease

## 2016-03-06 DIAGNOSIS — Z8546 Personal history of malignant neoplasm of prostate: Secondary | ICD-10-CM | POA: Diagnosis not present

## 2016-03-07 ENCOUNTER — Other Ambulatory Visit: Payer: Self-pay | Admitting: Cardiovascular Disease

## 2016-03-13 DIAGNOSIS — N5235 Erectile dysfunction following radiation therapy: Secondary | ICD-10-CM | POA: Diagnosis not present

## 2016-03-13 DIAGNOSIS — Z8546 Personal history of malignant neoplasm of prostate: Secondary | ICD-10-CM | POA: Diagnosis not present

## 2016-03-19 ENCOUNTER — Ambulatory Visit (INDEPENDENT_AMBULATORY_CARE_PROVIDER_SITE_OTHER): Payer: Medicare Other | Admitting: Pharmacist Clinician (PhC)/ Clinical Pharmacy Specialist

## 2016-03-19 DIAGNOSIS — Z7901 Long term (current) use of anticoagulants: Secondary | ICD-10-CM | POA: Diagnosis not present

## 2016-03-19 LAB — POCT INR: INR: 2.2

## 2016-04-16 ENCOUNTER — Other Ambulatory Visit: Payer: Self-pay | Admitting: Cardiovascular Disease

## 2016-05-01 ENCOUNTER — Ambulatory Visit (INDEPENDENT_AMBULATORY_CARE_PROVIDER_SITE_OTHER): Payer: Medicare Other | Admitting: Pharmacist

## 2016-05-01 DIAGNOSIS — Z7901 Long term (current) use of anticoagulants: Secondary | ICD-10-CM

## 2016-05-01 LAB — POCT INR: INR: 2.5

## 2016-05-16 ENCOUNTER — Ambulatory Visit (INDEPENDENT_AMBULATORY_CARE_PROVIDER_SITE_OTHER): Payer: Medicare Other | Admitting: Cardiovascular Disease

## 2016-05-16 ENCOUNTER — Encounter: Payer: Self-pay | Admitting: Cardiovascular Disease

## 2016-05-16 VITALS — BP 156/80 | HR 61 | Ht 72.0 in | Wt 205.6 lb

## 2016-05-16 DIAGNOSIS — Z79899 Other long term (current) drug therapy: Secondary | ICD-10-CM | POA: Diagnosis not present

## 2016-05-16 DIAGNOSIS — Z86718 Personal history of other venous thrombosis and embolism: Secondary | ICD-10-CM

## 2016-05-16 DIAGNOSIS — Z8546 Personal history of malignant neoplasm of prostate: Secondary | ICD-10-CM

## 2016-05-16 DIAGNOSIS — I10 Essential (primary) hypertension: Secondary | ICD-10-CM | POA: Diagnosis not present

## 2016-05-16 DIAGNOSIS — D7589 Other specified diseases of blood and blood-forming organs: Secondary | ICD-10-CM

## 2016-05-16 DIAGNOSIS — E785 Hyperlipidemia, unspecified: Secondary | ICD-10-CM | POA: Diagnosis not present

## 2016-05-16 DIAGNOSIS — Z7901 Long term (current) use of anticoagulants: Secondary | ICD-10-CM

## 2016-05-16 MED ORDER — SPIRONOLACTONE 25 MG PO TABS: ORAL_TABLET | ORAL | 0 refills | Status: DC

## 2016-05-16 NOTE — Patient Instructions (Signed)
Your physician has recommended you make the following change in your medication:  1.) the spironolactone has been changed to 1 tablet in the morning and 1/2 tablet in the evening.  Your physician recommends that you return for lab work fasting. You will receive your test results via mail, phone call, or by My chart.  Your physician wants you to follow-up in: 6 months or sooner if needed. You will receive a reminder letter in the mail two months in advance. If you don't receive a letter, please call our office to schedule the follow-up appointment.  If you need a refill on your cardiac medications before your next appointment, please call your pharmacy.

## 2016-05-16 NOTE — Progress Notes (Signed)
Patient ID: Pedro Kent, male   DOB: [birth year], 76 y.o.   MRN: [id number]    Primary MD: Dr. Tommy Medal  HPI: Pedro Kent is a 76 y.o. male who presents for a 9 month follow-up cardiology evaluation.  Pedro Kent developed a  deep vein thrombosis of his left lower extremity in January 2007and has been on chronic anticoagulation therapy with Coumadin ever since. Additional problems include hypertension with adrenal hyperplasia, history of prostate CA treated with history of radiation seeds,  hyperlipidemia, and an abnormal ECG with T wave inversion. An echo Doppler study in January 2011  showed mild concentric LVH, mild MR with possible torn redundant chordae tendinea, mild aortic valve sclerosis, and mild coronary insufficiency.   On 09/23/2012 an echo Doppler study showed an ejection fraction at 60-65%. He had mild left ventricular hypertrophy and had normal diastolic parameters on this present study. There is very minimal pulmonary pressure elevation at 31 mm. Is mitral valve is mildly thickened with no significant regurgitation.  He is on a 4 drug regimen for hypertension, consisting of amlodipine 10 mg, metoprolol 50 mg in the am and 25 mg in the pm, spironolactone 25 mg daily and valsartan 320 mg.  He is on Prilosec for GERD but this has been stable and he takes rarely.  He is taking atorvastatin 10 mg for hyperlipidemia.  He is on chronic warfarin therapy with his clot history.  He denies recent bleeding.  He tells me his PSA level was checked last month by his urologist.  This has been stable.  He denies any chest pain, palpitations, or significant shortness of breath. He continues to smoke small cigars.  He presents for evaluation  Past Medical History:  Diagnosis Date  . Adrenal hyperplasia ([facility])   . Cancer [facility]) 2010   prostate cancer  . Hyperlipidemia   . Hypertension     Past Surgical History:  Procedure Laterality Date  . 2-D echocardiogram  03/10/2009   Normal left  ventricular systolic function. Mild MR. Normal RV systolic pressure. Mild pulmonic valvular regurgitation.  . Cardiac stress test  04/05/2010   Negative for ischemia    Allergies  Allergen Reactions  . Ace Inhibitors     Current Outpatient Prescriptions  Medication Sig Dispense Refill  . amLODipine (NORVASC) 10 MG tablet Take 1 tablet (10 mg total) by mouth daily. KEEP OV. 90 tablet 0  . aspirin EC 81 MG tablet Take 81 mg by mouth daily.    Marland Kitchen atorvastatin (LIPITOR) 10 MG tablet Take 1 tablet (10 mg total) by mouth daily. Please call to schedule appointment with Dr. Claiborne Billings for further refills.  Thanks! 60 tablet 0  . Green Tea, Camillia sinensis, (GREEN TEA PO) Take by mouth. Cup of Tea daily.    . metoprolol (LOPRESSOR) 50 MG tablet TAKE ONE TABLET BY MOUTH IN THE MORNING AND ONE-HALF TABLET AT NIGHT 45 tablet 6  . Misc Natural Products (MENS PROSTATE HEALTH FORMULA PO) Take 4 capsules by mouth daily.    . Multiple Vitamins-Minerals (MULTIVITAMIN) tablet Take 1 tablet by mouth daily.    . NON FORMULARY Spoon full of Flax Seeds daily.    . NON FORMULARY Blue Berry Extract daily    . omeprazole (PRILOSEC) 20 MG capsule Take 20 mg by mouth daily.    Marland Kitchen spironolactone (ALDACTONE) 25 MG tablet Take 1 tablet (25 MG TOTAL) in the morning and 1/2 tablet ( 12.5 mg) in the evening 90 tablet 0  . tamsulosin (  FLOMAX) 0.4 MG CAPS capsule TAKE 1 CAPSULE TWICE A DAY 180 capsule 1  . valsartan (DIOVAN) 320 MG tablet TAKE ONE TABLET BY MOUTH EVERY DAY 90 tablet 2  . warfarin (COUMADIN) 5 MG tablet Take 1.5 tablets by mouth daily or as directed by coumadin clinic 150 tablet 0   No current facility-administered medications for this visit.     Social History   Social History  . Marital status: Divorced    Spouse name: N/A  . Number of children: N/A  . Years of education: N/A   Occupational History  . Not on file.   Social History Main Topics  . Smoking status: Current Every Day Smoker     Packs/day: 0.25    Types: Cigars  . Smokeless tobacco: Never Used  . Alcohol use Yes     Comment: rarely  . Drug use: Unknown  . Sexual activity: Not on file   Other Topics Concern  . Not on file   Social History Narrative  . No narrative on file   Socially he has 2 children one grandchild. He does walk. He denies recent alcohol use.  No family history on file.   ROS General: Negative; No fevers, chills, or night sweats;  HEENT: Negative; No changes in vision or hearing, sinus congestion, difficulty swallowing Pulmonary: Negative; No cough, wheezing, shortness of breath, hemoptysis Cardiovascular: Negative; No chest pain, presyncope, syncope, palpitations GI: positive for GERD on omeprazole No nausea, vomiting, diarrhea, or abdominal pain GU: Negative; No dysuria, hematuria, or difficulty voiding Musculoskeletal: Negative; no myalgias, joint pain, or weakness Hematologic/Oncology: Negative; no easy bruising, bleeding Endocrine: Negative; no heat/cold intolerance; no diabetes Neuro: Negative; no changes in balance, headaches Skin: Negative; No rashes or skin lesions Psychiatric: Negative; No behavioral problems, depression Sleep: Negative; No snoring, daytime sleepiness, hypersomnolence, bruxism, restless legs, hypnogognic hallucinations, no cataplexy Other comprehensive 14 point system review is negative.    PE BP (!) 156/80   Pulse 61   Ht 6' (1.829 m)   Wt 205 lb 9.6 oz (93.3 kg)   BMI 27.88 kg/m    Repeat blood pressure 144/80  Wt Readings from Last 3 Encounters:  05/16/16 205 lb 9.6 oz (93.3 kg)  06/28/15 201 lb 9.6 oz (91.4 kg)  01/20/15 200 lb (90.7 kg)   General: Alert, oriented, no distress.  Skin: normal turgor, no rashes HEENT: Normocephalic, atraumatic. Pupils round and reactive; sclera anicteric;no lid lag.  Nose without nasal septal hypertrophy Mouth/Parynx benign; Mallinpatti scale 3 Neck: No JVD, no carotid bruits with normal carotid  upstroke Lungs: clear to ausculatation and percussion; no wheezing or rales Chest wall: Nontender to palpation Heart: RRR, s1 s2 normal 1/6 systolic murmur, unchanged; .  No diastolic murmur.  No rubs thrills or heaves Abdomen: soft, nontender; no hepatosplenomehaly, BS+; abdominal aorta nontender and not dilated by palpation. Back: No CVA tenderness Pulses 2+ Extremities: no clubbing cyanosis or edema, Homan's sign negative  Neurologic: grossly nonfocal Psychological: Normal affect and mood  ECG (independently read by me): Normal sinus rhythm at 61 bpm with mild sinus arrhythmia.  Poor R wave progression.  Previously noted inferolateral T changes.  May 2017 ECG (independently read by me): Normal sinus rhythm with occasional PACs.  Previously noted inferolateral T wave abnormality.  QTc interval 410 ms.  December 2016 ECG (independently read by me):  Normal sinus rhythm with an occasional PAC. Previously noted inferolateral T wave inversion  November 2015 ECG (independently read by me): Normal sinus rhythm at  62 bpm.  Poor R-wave progression previously noted T-wave inversion inferolaterally  Prior September 2014 ECG: Sinus rhythm at 57 beats a minute. Previously noted T-wave abnormalities inferolaterally.  LABS: BMP Latest Ref Rng & Units 06/28/2015 01/20/2015 12/21/2013  Glucose 65 - 99 mg/dL 86 84 101(H)  BUN 7 - 25 mg/dL '18 25 14  ' Creatinine 0.70 - 1.18 mg/dL 1.11 1.19(H) 1.11  Sodium 135 - 146 mmol/L 141 138 139  Potassium 3.5 - 5.3 mmol/L 4.4 4.6 4.5  Chloride 98 - 110 mmol/L 108 105 105  CO2 20 - 31 mmol/L '24 25 27  ' Calcium 8.6 - 10.3 mg/dL 9.4 9.3 9.0   Hepatic Function Latest Ref Rng & Units 01/20/2015 12/21/2013 05/21/2008  Total Protein 6.1 - 8.1 g/dL 6.8 6.6 6.6  Albumin 3.6 - 5.1 g/dL 4.0 4.4 3.8  AST 10 - 35 U/L '31 24 23  ' ALT 9 - 46 U/L 30 32 30  Alk Phosphatase 40 - 115 U/L 58 59 69  Total Bilirubin 0.2 - 1.2 mg/dL 0.9 0.7 0.9   CBC Latest Ref Rng & Units 06/28/2015  01/20/2015 12/21/2013  WBC 3.8 - 10.8 K/uL 5.0 4.9 4.0  Hemoglobin 13.2 - 17.1 g/dL 15.2 14.3 15.0  Hematocrit 38.5 - 50.0 % 45.3 41.4 43.1  Platelets 140 - 400 K/uL 156 151 147(L)   Lab Results  Component Value Date   MCV 103.0 (H) 06/28/2015   MCV 101.5 (H) 01/20/2015   MCV 100.7 (H) 12/21/2013   Lab Results  Component Value Date   TSH 0.932 01/20/2015  No results found for: HGBA1C   Lipid Panel     Component Value Date/Time   CHOL 128 01/20/2015 1210   TRIG 46 01/20/2015 1210   HDL 45 01/20/2015 1210   CHOLHDL 2.8 01/20/2015 1210   VLDL 9 01/20/2015 1210   LDLCALC 74 01/20/2015 1210   IMPRESSION:  1. Essential hypertension   2. History of prostate cancer   3. Hyperlipidemia, unspecified hyperlipidemia type   4. Medication management   5. History of DVT (deep vein thrombosis)   6. Long term current use of anticoagulant therapy   7. Macrocytosis without anemia     ASSESSMENT AND PLAN: Pedro Kent is a 76 year old African-American gentleman who is 11 years since developing his deep vein thrombosis in January 2007. He has been on chronic Coumadin therapy ever since and has tolerated this without bleeding issues. His blood pressure today is widely elevated on a 4 drug regimen consisting of valsartan 320 mg, spironolactone 25 mg, metoprolol titrate 50/25 mg twice a day in addition to amlodipine 10 mg. have recommended that he increase spironolactone and take 25 mg in the morning and 12.5 mg at night.  If blood pressure remains elevated, he will then increase this to 25 mg twice a day.  I reviewed with him the new hypertensive guidelines with stage I hypertension beginning with a systolic blood pressure greater than 130.  He is not having any bleeding on Coumadin.  He is tolerating atorvastatin for hyperlipidemia and his LDL in December 2016 was 74.  He has been on Lipitor 10 mg for hyperlipidemia.  He also is on omeprazole for GERD.  When I last saw him, he had macrocytosis.  I  checked a B12 and folate level and these were normal.  As long as he remains stable, I will see him in 6 months for reevaluation.  Ti me spent: 25 minutes  Troy Sine, MD, [facility]  05/18/2016 4:30 PM

## 2016-05-18 LAB — CBC
HEMATOCRIT: 45.1 % (ref 38.5–50.0)
Hemoglobin: 15 g/dL (ref 13.2–17.1)
MCH: 34.6 pg — ABNORMAL HIGH (ref 27.0–33.0)
MCHC: 33.3 g/dL (ref 32.0–36.0)
MCV: 103.9 fL — ABNORMAL HIGH (ref 80.0–100.0)
MPV: 9.9 fL (ref 7.5–12.5)
Platelets: 140 10*3/uL (ref 140–400)
RBC: 4.34 MIL/uL (ref 4.20–5.80)
RDW: 14.1 % (ref 11.0–15.0)
WBC: 4.4 10*3/uL (ref 3.8–10.8)

## 2016-05-18 LAB — COMPREHENSIVE METABOLIC PANEL
ALT: 28 U/L (ref 9–46)
AST: 23 U/L (ref 10–35)
Albumin: 4 g/dL (ref 3.6–5.1)
Alkaline Phosphatase: 65 U/L (ref 40–115)
BUN: 18 mg/dL (ref 7–25)
CALCIUM: 9.3 mg/dL (ref 8.6–10.3)
CHLORIDE: 107 mmol/L (ref 98–110)
CO2: 27 mmol/L (ref 20–31)
Creat: 1.21 mg/dL — ABNORMAL HIGH (ref 0.70–1.18)
Glucose, Bld: 100 mg/dL — ABNORMAL HIGH (ref 65–99)
POTASSIUM: 4.1 mmol/L (ref 3.5–5.3)
Sodium: 141 mmol/L (ref 135–146)
Total Bilirubin: 0.6 mg/dL (ref 0.2–1.2)
Total Protein: 6.8 g/dL (ref 6.1–8.1)

## 2016-05-18 LAB — LIPID PANEL
CHOL/HDL RATIO: 2.9 ratio (ref <5.0)
Cholesterol: 135 mg/dL (ref <200)
HDL: 47 mg/dL (ref >40)
LDL CALC: 74 mg/dL (ref <100)
TRIGLYCERIDES: 72 mg/dL (ref <150)
VLDL: 14 mg/dL (ref <30)

## 2016-05-19 LAB — TSH: TSH: 0.91 mIU/L (ref 0.40–4.50)

## 2016-05-29 ENCOUNTER — Encounter: Payer: Self-pay | Admitting: *Deleted

## 2016-06-11 ENCOUNTER — Other Ambulatory Visit: Payer: Self-pay | Admitting: Cardiovascular Disease

## 2016-06-13 ENCOUNTER — Ambulatory Visit (INDEPENDENT_AMBULATORY_CARE_PROVIDER_SITE_OTHER): Payer: Medicare Other | Admitting: Pharmacist

## 2016-06-13 DIAGNOSIS — Z7901 Long term (current) use of anticoagulants: Secondary | ICD-10-CM | POA: Diagnosis not present

## 2016-06-13 LAB — POCT INR: INR: 2.5

## 2016-06-18 ENCOUNTER — Other Ambulatory Visit: Payer: Self-pay | Admitting: Cardiovascular Disease

## 2016-07-13 ENCOUNTER — Other Ambulatory Visit: Payer: Self-pay | Admitting: Cardiovascular Disease

## 2016-07-13 NOTE — Telephone Encounter (Signed)
Rx has been sent to the pharmacy electronically. ° °

## 2016-07-17 ENCOUNTER — Telehealth: Payer: Self-pay | Admitting: Radiation Oncology

## 2016-07-17 NOTE — Telephone Encounter (Signed)
Received refill fax request from [facility] for patient's Flomax.

## 2016-07-17 NOTE — Telephone Encounter (Signed)
Faxed back request instructing the patient to lean on urologist for refill since he hasn't been seen in our over in over a year and has no future appointments.

## 2016-07-19 ENCOUNTER — Other Ambulatory Visit: Payer: Self-pay | Admitting: Cardiovascular Disease

## 2016-07-20 ENCOUNTER — Other Ambulatory Visit: Payer: Self-pay | Admitting: Cardiovascular Disease

## 2016-07-25 ENCOUNTER — Ambulatory Visit (INDEPENDENT_AMBULATORY_CARE_PROVIDER_SITE_OTHER): Payer: Medicare Other | Admitting: Pharmacist

## 2016-07-25 DIAGNOSIS — Z7901 Long term (current) use of anticoagulants: Secondary | ICD-10-CM | POA: Diagnosis not present

## 2016-07-25 LAB — POCT INR: INR: 2.6

## 2016-07-30 ENCOUNTER — Other Ambulatory Visit: Payer: Self-pay | Admitting: Cardiovascular Disease

## 2016-09-04 ENCOUNTER — Other Ambulatory Visit: Payer: Self-pay | Admitting: Cardiovascular Disease

## 2016-09-05 ENCOUNTER — Ambulatory Visit (INDEPENDENT_AMBULATORY_CARE_PROVIDER_SITE_OTHER): Payer: Medicare Other | Admitting: Pharmacist

## 2016-09-05 DIAGNOSIS — Z7901 Long term (current) use of anticoagulants: Secondary | ICD-10-CM | POA: Diagnosis not present

## 2016-09-05 LAB — POCT INR: INR: 3

## 2016-09-06 ENCOUNTER — Telehealth: Payer: Self-pay | Admitting: Cardiovascular Disease

## 2016-09-07 NOTE — Telephone Encounter (Signed)
Patient will like to stay on valsartan due to possibility of SE with new medication. Called to request RX to be sent to [facility].  [facility] don't have valsartan 320mg  available at this time either.   Possibility to change to Irbesartan 300mg  daily given to patient but he refused change.

## 2016-09-12 ENCOUNTER — Other Ambulatory Visit: Payer: Self-pay | Admitting: Cardiovascular Disease

## 2016-09-12 NOTE — Telephone Encounter (Signed)
REFILL 

## 2016-09-19 DIAGNOSIS — H40033 Anatomical narrow angle, bilateral: Secondary | ICD-10-CM | POA: Diagnosis not present

## 2016-09-19 DIAGNOSIS — H1013 Acute atopic conjunctivitis, bilateral: Secondary | ICD-10-CM | POA: Diagnosis not present

## 2016-10-05 ENCOUNTER — Telehealth: Payer: Self-pay | Admitting: Cardiovascular Disease

## 2016-10-05 ENCOUNTER — Other Ambulatory Visit: Payer: Self-pay | Admitting: Pharmacist

## 2016-10-05 MED ORDER — IRBESARTAN 300 MG PO TABS: 300.0000 mg | ORAL_TABLET | Freq: Every day | ORAL | 5 refills | Status: DC

## 2016-10-05 NOTE — Telephone Encounter (Signed)
Patient calling, states that he needs to talk with you about getting one of his medications refilled. Patient did not know the name but states that you would know what he is referring to.

## 2016-10-05 NOTE — Telephone Encounter (Signed)
Patient agreed on change from valsartan to irbesartan.  Hx of HTN noted. No HF or any other complication.  Rx for irbesartan 300mg  daily sent to prefer pharmacy. Will check BP during next coumadin f/u visit on 10/17/16

## 2016-10-17 ENCOUNTER — Ambulatory Visit (INDEPENDENT_AMBULATORY_CARE_PROVIDER_SITE_OTHER): Payer: Medicare Other | Admitting: Pharmacist

## 2016-10-17 DIAGNOSIS — Z7901 Long term (current) use of anticoagulants: Secondary | ICD-10-CM | POA: Diagnosis not present

## 2016-10-17 LAB — POCT INR: INR: 3.9

## 2016-11-16 ENCOUNTER — Ambulatory Visit (INDEPENDENT_AMBULATORY_CARE_PROVIDER_SITE_OTHER): Payer: Medicare Other | Admitting: Pharmacist

## 2016-11-16 ENCOUNTER — Encounter: Payer: Self-pay | Admitting: Cardiovascular Disease

## 2016-11-16 ENCOUNTER — Ambulatory Visit (INDEPENDENT_AMBULATORY_CARE_PROVIDER_SITE_OTHER): Payer: Medicare Other | Admitting: Cardiovascular Disease

## 2016-11-16 VITALS — BP 124/70 | HR 59 | Ht 72.0 in | Wt 202.6 lb

## 2016-11-16 DIAGNOSIS — Z86718 Personal history of other venous thrombosis and embolism: Secondary | ICD-10-CM

## 2016-11-16 DIAGNOSIS — Z7901 Long term (current) use of anticoagulants: Secondary | ICD-10-CM

## 2016-11-16 DIAGNOSIS — Z720 Tobacco use: Secondary | ICD-10-CM

## 2016-11-16 DIAGNOSIS — I10 Essential (primary) hypertension: Secondary | ICD-10-CM

## 2016-11-16 DIAGNOSIS — D7589 Other specified diseases of blood and blood-forming organs: Secondary | ICD-10-CM | POA: Diagnosis not present

## 2016-11-16 DIAGNOSIS — E785 Hyperlipidemia, unspecified: Secondary | ICD-10-CM | POA: Diagnosis not present

## 2016-11-16 LAB — POCT INR: INR: 3.3

## 2016-11-16 NOTE — Patient Instructions (Signed)
Medication Instructions:  Your physician recommends that you continue on your current medications as directed. Please refer to the Current Medication list given to you today.  Follow-Up: Your physician wants you to follow-up in: 12 MONTHS with Dr. Kelly. You will receive a reminder letter in the mail two months in advance. If you don't receive a letter, please call our office to schedule the follow-up appointment.   Any Other Special Instructions Will Be Listed Below (If Applicable).     If you need a refill on your cardiac medications before your next appointment, please call your pharmacy.   

## 2016-11-16 NOTE — Progress Notes (Signed)
Patient ID: Pedro Kent, male   DOB: 03/30/1940, 76 y.o.   MRN: [id number]    Primary MD: Dr. Tommy Medal  HPI: Pedro Kent is a 76 y.o. male who presents for a 6 month follow-up cardiology evaluation.  Pedro Kent developed a  deep vein thrombosis of his left lower extremity in January 2007and has been on chronic anticoagulation therapy with Coumadin ever since. Additional problems include hypertension with adrenal hyperplasia, history of prostate CA treated with history of radiation seeds,  hyperlipidemia, and an abnormal ECG with T wave inversion. An echo Doppler study in January 2011  showed mild concentric LVH, mild MR with possible torn redundant chordae tendinea, mild aortic valve sclerosis, and mild coronary insufficiency.   On 09/23/2012 an echo Doppler study showed an ejection fraction at 60-65%. He had mild left ventricular hypertrophy and had normal diastolic parameters on this present study. There is very minimal pulmonary pressure elevation at 31 mm. Is mitral valve is mildly thickened with no significant regurgitation.  When I last saw him he was on a multiple drug drug regimen for hypertension, consisting of amlodipine 10 mg, metoprolol 50 mg in the am and 25 mg in the pm, spironolactone 25 mg daily and valsartan 320 mg.   his blood pressure was elevated and I recommended that he increase spironolactone and take 25 mg in the morning and 12.5 mg at night.  Ifblood pressure continued to be elevated, then he will increase this to 25 mg twice a day.  Over the past several months, he states his blood pressure has improved.  He will be establishing new primary care with Dr. Maudie Mercury.  He has seen Dr. Karsten Ro for his prostate CA.  He continues to take Prilosec for GERD but this has been stable and he takes rarely.  He is taking atorvastatin 10 mg for hyperlipidemia.  He is on chronic warfarin therapy with his clot history.  He denies recent bleeding.  He continues to smoke several small cigars  per day.  He denies any chest pain, PND, orthopnea.  He presents for evaluation.  Past Medical History:  Diagnosis Date  . Adrenal hyperplasia ([place])   . Cancer [facility]) 2010   prostate cancer  . Hyperlipidemia   . Hypertension     Past Surgical History:  Procedure Laterality Date  . 2-D echocardiogram  03/10/2009   Normal left ventricular systolic function. Mild MR. Normal RV systolic pressure. Mild pulmonic valvular regurgitation.  . Cardiac stress test  04/05/2010   Negative for ischemia    Allergies  Allergen Reactions  . Ace Inhibitors     Current Outpatient Prescriptions  Medication Sig Dispense Refill  . amLODipine (NORVASC) 10 MG tablet Take 1 tablet by mouth daily. 90 tablet 0  . aspirin EC 81 MG tablet Take 81 mg by mouth daily.    Marland Kitchen atorvastatin (LIPITOR) 10 MG tablet Take 1 tablet (10 mg total) by mouth daily at 6 PM. 30 tablet 11  . Green Tea, Camillia sinensis, (GREEN TEA PO) Take by mouth. Cup of Tea daily.    . irbesartan (AVAPRO) 300 MG tablet Take 1 tablet (300 mg total) by mouth daily. 30 tablet 5  . metoprolol tartrate (LOPRESSOR) 50 MG tablet TAKE ONE TABLET BY MOUTH IN THE MORNING AND ONE-HALF TABLET AT NIGHT 45 tablet 6  . Misc Natural Products (MENS PROSTATE HEALTH FORMULA PO) Take 4 capsules by mouth daily.    . Multiple Vitamins-Minerals (MULTIVITAMIN) tablet Take 1 tablet by mouth  daily.    . NON FORMULARY Spoon full of Flax Seeds daily.    . NON FORMULARY Blue Berry Extract daily    . omeprazole (PRILOSEC) 20 MG capsule Take 20 mg by mouth daily.    Marland Kitchen spironolactone (ALDACTONE) 25 MG tablet Take 1 tablet (25 MG TOTAL) in the morning and 1/2 tablet (12.5 mg) in the evening 90 tablet 5  . tamsulosin (FLOMAX) 0.4 MG CAPS capsule TAKE 1 CAPSULE TWICE A DAY 180 capsule 1  . warfarin (COUMADIN) 5 MG tablet Take 1.5 tablets by mouth daily or as directed by coumadin clinic 150 tablet 0   No current facility-administered medications for this visit.      Social History   Social History  . Marital status: Divorced    Spouse name: N/A  . Number of children: N/A  . Years of education: N/A   Occupational History  . Not on file.   Social History Main Topics  . Smoking status: Current Every Day Smoker    Packs/day: 0.25    Types: Cigars  . Smokeless tobacco: Never Used  . Alcohol use Yes     Comment: rarely  . Drug use: Unknown  . Sexual activity: Not on file   Other Topics Concern  . Not on file   Social History Narrative  . No narrative on file   Socially he has 2 children one grandchild. He does walk. He denies recent alcohol use.  No family history on file.   ROS General: Negative; No fevers, chills, or night sweats;  HEENT: Negative; No changes in vision or hearing, sinus congestion, difficulty swallowing Pulmonary: Negative; No cough, wheezing, shortness of breath, hemoptysis Cardiovascular: See history of present illness GI: positive for GERD on omeprazole No nausea, vomiting, diarrhea, or abdominal pain GU: Negative; No dysuria, hematuria, or difficulty voiding Musculoskeletal: Negative; no myalgias, joint pain, or weakness Hematologic/Oncology: Negative; no easy bruising, bleeding Endocrine: Negative; no heat/cold intolerance; no diabetes Neuro: Negative; no changes in balance, headaches Skin: Negative; No rashes or skin lesions Psychiatric: Negative; No behavioral problems, depression Sleep: Negative; No snoring, daytime sleepiness, hypersomnolence, bruxism, restless legs, hypnogognic hallucinations, no cataplexy Other comprehensive 14 point system review is negative.    PE BP 124/70   Pulse (!) 59   Ht 6' (1.829 m)   Wt 202 lb 9.6 oz (91.9 kg)   BMI 27.48 kg/m    Repeat blood pressure 128/68  Wt Readings from Last 3 Encounters:  11/16/16 202 lb 9.6 oz (91.9 kg)  05/16/16 205 lb 9.6 oz (93.3 kg)  06/28/15 201 lb 9.6 oz (91.4 kg)      Physical Exam BP 124/70   Pulse (!) 59   Ht 6' (1.829  m)   Wt 202 lb 9.6 oz (91.9 kg)   BMI 27.48 kg/m  General: Alert, oriented, no distress.  Skin: normal turgor, no rashes, warm and dry HEENT: Normocephalic, atraumatic. Pupils equal round and reactive to light; sclera anicteric; extraocular muscles intact;  Nose without nasal septal hypertrophy Mouth/Parynx benign; Mallinpatti scale 3 Neck: No JVD, no carotid bruits; normal carotid upstroke Lungs: clear to ausculatation and percussion; no wheezing or rales Chest wall: without tenderness to palpitation Heart: PMI not displaced, RRR, s1 s2 normal, 1/6 systolic murmur, no diastolic murmur, no rubs, gallops, thrills, or heaves Abdomen: soft, nontender; no hepatosplenomehaly, BS+; abdominal aorta nontender and not dilated by palpation. Back: no CVA tenderness Pulses 2+ Musculoskeletal: full range of motion, normal strength, no joint deformities Extremities: no  clubbing cyanosis or edema, Homan's sign negative  Neurologic: grossly nonfocal; Cranial nerves grossly wnl Psychologic: Normal mood and affect   ECG (independently read by me): Sinus tachycardia 59 bpm.  Poor progression V1 through V3.  Normal intervals.  No ectopy.  March 2018 ECG (independently read by me): Normal sinus rhythm at 61 bpm with mild sinus arrhythmia.  Poor R wave progression.  Previously noted inferolateral T changes.  May 2017 ECG (independently read by me): Normal sinus rhythm with occasional PACs.  Previously noted inferolateral T wave abnormality.  QTc interval 410 ms.  December 2016 ECG (independently read by me):  Normal sinus rhythm with an occasional PAC. Previously noted inferolateral T wave inversion  November 2015 ECG (independently read by me): Normal sinus rhythm at 62 bpm.  Poor R-wave progression previously noted T-wave inversion inferolaterally  Prior September 2014 ECG: Sinus rhythm at 57 beats a minute. Previously noted T-wave abnormalities inferolaterally.  LABS: BMP Latest Ref Rng & Units  05/16/2016 06/28/2015 01/20/2015  Glucose 65 - 99 mg/dL 100(H) 86 84  BUN 7 - 25 mg/dL _0 Creatinine 0.70 - 1.18 mg/dL 1.21(H) 1.11 1.19(H)  Sodium 135 - 146 mmol/L 141 141 138  Potassium 3.5 - 5.3 mmol/L 4.1 4.4 4.6  Chloride 98 - 110 mmol/L 107 108 105  CO2 20 - 31 mmol/L _1 Calcium 8.6 - 10.3 mg/dL 9.3 9.4 9.3   Hepatic Function Latest Ref Rng & Units 05/16/2016 01/20/2015 12/21/2013  Total Protein 6.1 - 8.1 g/dL 6.8 6.8 6.6  Albumin 3.6 - 5.1 g/dL 4.0 4.0 4.4  AST 10 - 35 U/L _2 ALT 9 - 46 U/L 28 30 32  Alk Phosphatase 40 - 115 U/L 65 58 59  Total Bilirubin 0.2 - 1.2 mg/dL 0.6 0.9 0.7   CBC Latest Ref Rng & Units 05/16/2016 06/28/2015 01/20/2015  WBC 3.8 - 10.8 K/uL 4.4 5.0 4.9  Hemoglobin 13.2 - 17.1 g/dL 15.0 15.2 14.3  Hematocrit 38.5 - 50.0 % 45.1 45.3 41.4  Platelets 140 - 400 K/uL 140 156 151   Lab Results  Component Value Date   MCV 103.9 (H) 05/16/2016   MCV 103.0 (H) 06/28/2015   MCV 101.5 (H) 01/20/2015   Lab Results  Component Value Date   TSH 0.91 05/16/2016  No results found for: HGBA1C   Lipid Panel     Component Value Date/Time   CHOL 135 05/16/2016 1127   TRIG 72 05/16/2016 1127   HDL 47 05/16/2016 1127   CHOLHDL 2.9 05/16/2016 1127   VLDL 14 05/16/2016 1127   LDLCALC 74 05/16/2016 1127   IMPRESSION:  1. Essential hypertension   2. History of DVT (deep vein thrombosis)   3. Long term current use of anticoagulant therapy   4. Tobacco use   5. Hyperlipidemia, unspecified hyperlipidemia type   6. Macrocytosis without anemia     ASSESSMENT AND PLAN: Mr. Kabel is a 76 year old African-American gentleman who is 11 years since developing his deep vein thrombosis in January 2007. He has been on chronic Coumadin therapy ever since and has tolerated this without bleeding issues.  When I last saw him, despite being on a 4 drug regimen, his blood pressure was elevated.  I increased his spironolactone, and he is now on amlodipine 10 mg,  irbesartan 300 mg, the Toprol all 50 mg in the morning and 25 mg at night, and spironolactone 25 mg in the morning and 12.5 mg at night.  His blood pressure today is stable.  He has felt well.  He is unaware of any arrhythmia.  He denies any chest tightness or angina.  His valsartan had been switched to irbesartan with the recent valsartan impurity noted in his generic generic preparation.  He continues to be on atorvastatin.  In April 2018, LDL was 74.  He is previously been noted to have macrocytosis but previous B12 and folate levels have been normal.  He continues to undergo routine urologic evaluations with his remote history of prostate CA.  As long as he remains stable I will see him in one year for reevaluation. Ti me spent: 25 minutes  Troy Sine, MD, [facility]  11/18/2016 8:43 PM

## 2016-12-06 ENCOUNTER — Other Ambulatory Visit: Payer: Self-pay | Admitting: Cardiovascular Disease

## 2016-12-07 ENCOUNTER — Ambulatory Visit (INDEPENDENT_AMBULATORY_CARE_PROVIDER_SITE_OTHER): Payer: Medicare Other | Admitting: Pharmacist Clinician (PhC)/ Clinical Pharmacy Specialist

## 2016-12-07 DIAGNOSIS — Z7901 Long term (current) use of anticoagulants: Secondary | ICD-10-CM | POA: Diagnosis not present

## 2016-12-07 DIAGNOSIS — I82409 Acute embolism and thrombosis of unspecified deep veins of unspecified lower extremity: Secondary | ICD-10-CM | POA: Diagnosis not present

## 2016-12-07 LAB — POCT INR: INR: 3.8

## 2016-12-21 ENCOUNTER — Ambulatory Visit (INDEPENDENT_AMBULATORY_CARE_PROVIDER_SITE_OTHER): Payer: Medicare Other | Admitting: Pharmacist

## 2016-12-21 DIAGNOSIS — Z7901 Long term (current) use of anticoagulants: Secondary | ICD-10-CM

## 2016-12-21 LAB — POCT INR: INR: 3.4

## 2016-12-25 ENCOUNTER — Other Ambulatory Visit: Payer: Self-pay | Admitting: Cardiovascular Disease

## 2016-12-25 NOTE — Telephone Encounter (Signed)
REFILL 

## 2017-01-14 ENCOUNTER — Ambulatory Visit (INDEPENDENT_AMBULATORY_CARE_PROVIDER_SITE_OTHER): Payer: Medicare Other | Admitting: Pharmacist

## 2017-01-14 DIAGNOSIS — Z7901 Long term (current) use of anticoagulants: Secondary | ICD-10-CM

## 2017-01-14 LAB — POCT INR: INR: 4.7

## 2017-01-30 ENCOUNTER — Ambulatory Visit (INDEPENDENT_AMBULATORY_CARE_PROVIDER_SITE_OTHER): Payer: Medicare Other | Admitting: Pharmacist

## 2017-01-30 DIAGNOSIS — Z7901 Long term (current) use of anticoagulants: Secondary | ICD-10-CM

## 2017-01-30 LAB — POCT INR: INR: 2.6

## 2017-02-06 ENCOUNTER — Other Ambulatory Visit: Payer: Self-pay | Admitting: Cardiovascular Disease

## 2017-02-18 ENCOUNTER — Telehealth: Payer: Self-pay | Admitting: Cardiovascular Disease

## 2017-02-18 MED ORDER — SPIRONOLACTONE 25 MG PO TABS: ORAL_TABLET | ORAL | 6 refills | Status: DC

## 2017-02-18 NOTE — Telephone Encounter (Signed)
°*  STAT* If patient is at the pharmacy, call can be transferred to refill team.   1. Which medications need to be refilled? (please list name of each medication and dose if known) Need a new written  prescription for  Spironolactone today if possible please  2. Which pharmacy/location (including street and city if local pharmacy) is medication to be sent to?Pt will pick up the prescription  3.0 day supply?

## 2017-02-18 NOTE — Telephone Encounter (Signed)
Returned call to patient he stated he needs refill for Aldactone.Refill sent to [place].

## 2017-02-21 ENCOUNTER — Other Ambulatory Visit: Payer: Self-pay | Admitting: Cardiovascular Disease

## 2017-02-27 ENCOUNTER — Ambulatory Visit (INDEPENDENT_AMBULATORY_CARE_PROVIDER_SITE_OTHER): Payer: Medicare Other | Admitting: Pharmacist Clinician (PhC)/ Clinical Pharmacy Specialist

## 2017-02-27 DIAGNOSIS — Z7901 Long term (current) use of anticoagulants: Secondary | ICD-10-CM | POA: Diagnosis not present

## 2017-02-27 DIAGNOSIS — I82402 Acute embolism and thrombosis of unspecified deep veins of left lower extremity: Secondary | ICD-10-CM

## 2017-02-27 LAB — POCT INR: INR: 3.1

## 2017-02-27 NOTE — Patient Instructions (Signed)
Description   Continue taking 1 tablet each Monday , Wednesday and Friday, 1.5 tablets all other days,  Repeat INR in 4 weeks  BP 132/82

## 2017-03-11 DIAGNOSIS — C61 Malignant neoplasm of prostate: Secondary | ICD-10-CM | POA: Diagnosis not present

## 2017-03-15 DIAGNOSIS — Z8546 Personal history of malignant neoplasm of prostate: Secondary | ICD-10-CM | POA: Diagnosis not present

## 2017-03-19 ENCOUNTER — Other Ambulatory Visit: Payer: Self-pay

## 2017-03-19 MED ORDER — IRBESARTAN 300 MG PO TABS: 300.0000 mg | ORAL_TABLET | Freq: Every day | ORAL | 2 refills | Status: DC

## 2017-03-19 NOTE — Telephone Encounter (Signed)
Rx(s) sent to pharmacy electronically.  

## 2017-03-27 ENCOUNTER — Ambulatory Visit (INDEPENDENT_AMBULATORY_CARE_PROVIDER_SITE_OTHER): Payer: Medicare Other | Admitting: Pharmacist

## 2017-03-27 DIAGNOSIS — Z7901 Long term (current) use of anticoagulants: Secondary | ICD-10-CM

## 2017-03-27 LAB — POCT INR: INR: 3

## 2017-04-24 ENCOUNTER — Ambulatory Visit (INDEPENDENT_AMBULATORY_CARE_PROVIDER_SITE_OTHER): Payer: Medicare Other | Admitting: Pharmacist

## 2017-04-24 DIAGNOSIS — Z7901 Long term (current) use of anticoagulants: Secondary | ICD-10-CM | POA: Diagnosis not present

## 2017-04-24 LAB — POCT INR: INR: 2.1

## 2017-04-25 ENCOUNTER — Other Ambulatory Visit: Payer: Self-pay | Admitting: Cardiovascular Disease

## 2017-04-25 NOTE — Telephone Encounter (Signed)
REFILL 

## 2017-06-05 ENCOUNTER — Ambulatory Visit (INDEPENDENT_AMBULATORY_CARE_PROVIDER_SITE_OTHER): Payer: Medicare Other | Admitting: Pharmacist Clinician (PhC)/ Clinical Pharmacy Specialist

## 2017-06-05 DIAGNOSIS — Z7901 Long term (current) use of anticoagulants: Secondary | ICD-10-CM

## 2017-06-05 DIAGNOSIS — I82409 Acute embolism and thrombosis of unspecified deep veins of unspecified lower extremity: Secondary | ICD-10-CM

## 2017-06-05 LAB — POCT INR: INR: 1.8

## 2017-06-26 ENCOUNTER — Ambulatory Visit (INDEPENDENT_AMBULATORY_CARE_PROVIDER_SITE_OTHER): Payer: Medicare Other | Admitting: Pharmacist

## 2017-06-26 DIAGNOSIS — Z7901 Long term (current) use of anticoagulants: Secondary | ICD-10-CM

## 2017-06-26 LAB — POCT INR: INR: 2.1

## 2017-07-05 ENCOUNTER — Other Ambulatory Visit: Payer: Self-pay | Admitting: Cardiovascular Disease

## 2017-07-22 ENCOUNTER — Other Ambulatory Visit: Payer: Self-pay | Admitting: Cardiovascular Disease

## 2017-08-07 ENCOUNTER — Ambulatory Visit (INDEPENDENT_AMBULATORY_CARE_PROVIDER_SITE_OTHER): Payer: Medicare Other | Admitting: Pharmacist

## 2017-08-07 DIAGNOSIS — Z7901 Long term (current) use of anticoagulants: Secondary | ICD-10-CM

## 2017-08-07 LAB — POCT INR: INR: 2.5 (ref 2.0–3.0)

## 2017-08-27 ENCOUNTER — Other Ambulatory Visit: Payer: Self-pay | Admitting: Cardiovascular Disease

## 2017-09-01 ENCOUNTER — Emergency Department (HOSPITAL_COMMUNITY): Payer: Medicare Other

## 2017-09-01 ENCOUNTER — Other Ambulatory Visit: Payer: Self-pay

## 2017-09-01 ENCOUNTER — Emergency Department (HOSPITAL_COMMUNITY)
Admission: EM | Admit: 2017-09-01 | Discharge: 2017-09-01 | Disposition: A | Discharge status: Home / Self Care | Payer: Medicare Other | Attending: Emergency Medicine | Admitting: Emergency Medicine

## 2017-09-01 ENCOUNTER — Encounter (HOSPITAL_COMMUNITY): Payer: Self-pay | Admitting: Emergency Medicine

## 2017-09-01 DIAGNOSIS — Z7901 Long term (current) use of anticoagulants: Secondary | ICD-10-CM | POA: Insufficient documentation

## 2017-09-01 DIAGNOSIS — Z7982 Long term (current) use of aspirin: Secondary | ICD-10-CM | POA: Insufficient documentation

## 2017-09-01 DIAGNOSIS — Z79899 Other long term (current) drug therapy: Secondary | ICD-10-CM | POA: Insufficient documentation

## 2017-09-01 DIAGNOSIS — I10 Essential (primary) hypertension: Secondary | ICD-10-CM | POA: Diagnosis not present

## 2017-09-01 DIAGNOSIS — Z8546 Personal history of malignant neoplasm of prostate: Secondary | ICD-10-CM | POA: Insufficient documentation

## 2017-09-01 DIAGNOSIS — F17290 Nicotine dependence, other tobacco product, uncomplicated: Secondary | ICD-10-CM | POA: Insufficient documentation

## 2017-09-01 DIAGNOSIS — R42 Dizziness and giddiness: Secondary | ICD-10-CM | POA: Insufficient documentation

## 2017-09-01 DIAGNOSIS — R270 Ataxia, unspecified: Secondary | ICD-10-CM | POA: Diagnosis not present

## 2017-09-01 HISTORY — DX: Acute embolism and thrombosis of unspecified deep veins of unspecified lower extremity: I82.409

## 2017-09-01 LAB — BASIC METABOLIC PANEL
Anion gap: 7 (ref 5–15)
BUN: 16 mg/dL (ref 8–23)
CALCIUM: 9.4 mg/dL (ref 8.9–10.3)
CO2: 24 mmol/L (ref 22–32)
CREATININE: 1.34 mg/dL — AB (ref 0.61–1.24)
Chloride: 105 mmol/L (ref 98–111)
GFR calc Af Amer: 57 mL/min — ABNORMAL LOW (ref >60)
GFR, EST NON AFRICAN AMERICAN: 49 mL/min — AB (ref >60)
Glucose, Bld: 104 mg/dL — ABNORMAL HIGH (ref 70–99)
Potassium: 4.2 mmol/L (ref 3.5–5.1)
SODIUM: 136 mmol/L (ref 135–145)

## 2017-09-01 LAB — PROTIME-INR
INR: 2.17
PROTHROMBIN TIME: 24 s — AB (ref 11.4–15.2)

## 2017-09-01 LAB — URINALYSIS, ROUTINE W REFLEX MICROSCOPIC
Bilirubin Urine: NEGATIVE
GLUCOSE, UA: NEGATIVE mg/dL
HGB URINE DIPSTICK: NEGATIVE
KETONES UR: NEGATIVE mg/dL
LEUKOCYTES UA: NEGATIVE
Nitrite: NEGATIVE
PROTEIN: NEGATIVE mg/dL
Specific Gravity, Urine: 1.009 (ref 1.005–1.030)
pH: 6 (ref 5.0–8.0)

## 2017-09-01 LAB — CBC
HCT: 42.3 % (ref 39.0–52.0)
Hemoglobin: 14.4 g/dL (ref 13.0–17.0)
MCH: 34 pg (ref 26.0–34.0)
MCHC: 34 g/dL (ref 30.0–36.0)
MCV: 99.8 fL (ref 78.0–100.0)
PLATELETS: 164 10*3/uL (ref 150–400)
RBC: 4.24 MIL/uL (ref 4.22–5.81)
RDW: 12.5 % (ref 11.5–15.5)
WBC: 6 10*3/uL (ref 4.0–10.5)

## 2017-09-01 MED ORDER — SPIRONOLACTONE 12.5 MG HALF TABLET
12.5000 mg | ORAL_TABLET | Freq: Every day | ORAL | Status: DC
  Filled 2017-09-01: qty 1

## 2017-09-01 MED ORDER — MECLIZINE HCL 25 MG PO TABS
25.0000 mg | ORAL_TABLET | Freq: Once | ORAL | Status: AC
  Administered 2017-09-01: 25 mg via ORAL
  Filled 2017-09-01: qty 1

## 2017-09-01 MED ORDER — LORAZEPAM 0.5 MG PO TABS
0.5000 mg | ORAL_TABLET | Freq: Once | ORAL | Status: AC
  Administered 2017-09-01: 0.5 mg via ORAL
  Filled 2017-09-01: qty 1

## 2017-09-01 MED ORDER — METOPROLOL TARTRATE 25 MG PO TABS
25.0000 mg | ORAL_TABLET | Freq: Once | ORAL | Status: DC
  Filled 2017-09-01: qty 1

## 2017-09-01 MED ORDER — SODIUM CHLORIDE 0.9 % IV BOLUS
500.0000 mL | Freq: Once | INTRAVENOUS | Status: AC
  Administered 2017-09-01: 500 mL via INTRAVENOUS

## 2017-09-01 MED ORDER — MECLIZINE HCL 25 MG PO TABS: 25.0000 mg | ORAL_TABLET | Freq: Three times a day (TID) | ORAL | 0 refills | Status: DC | PRN

## 2017-09-01 NOTE — ED Triage Notes (Addendum)
States got dizzy when he turnred over in bed 4 am Saturday ,no n/v/headache no blurred vision, pt is on coumadin for dvt, VAN is neg

## 2017-09-01 NOTE — ED Provider Notes (Signed)
[facility] EMERGENCY DEPARTMENT Provider Note   CSN: [id number] Arrival date & time: 09/01/17  1310     History   Chief Complaint Chief Complaint  Patient presents with  . Dizziness    HPI Pedro Kent is a 77 y.o. male.  HPI Patient presents to the emergency room for evaluation of dizziness.  Patient states the symptoms started a couple of days ago.  He went to pick something up when all of a sudden he felt dizzy and the room started to spin.  Those symptoms have persisted intermittently since then.  He has noticed that certain positions will increase his dizziness and off-balance sensation.  Denies any trouble with his speech .  No trouble with vision.  No trouble with numbness or weakness.  Patient symptoms were getting worse so he came to the emergency room for evaluation.  Patient does not have a history of stroke but he does have history of DVT hyperlipidemia and hypertension. Past Medical History:  Diagnosis Date  . Adrenal hyperplasia ([place])   . Cancer [facility]) 2010   prostate cancer  . DVT (deep venous thrombosis) ([place])   . Hyperlipidemia   . Hypertension     Patient Active Problem List   Diagnosis Date Noted  . Anxiety 06/30/2015  . Tobacco use 12/21/2013  . History of prostate cancer 10/27/2012  . ECG abnormality 10/27/2012  . Long term current use of anticoagulant therapy 05/08/2012  . DVT (deep venous thrombosis), unspecified laterality 05/08/2012  . Essential hypertension 02/04/2012    Class: Diagnosis of  . Hyperlipidemia 02/04/2012    Class: History of    Past Surgical History:  Procedure Laterality Date  . 2-D echocardiogram  03/10/2009   Normal left ventricular systolic function. Mild MR. Normal RV systolic pressure. Mild pulmonic valvular regurgitation.  . Cardiac stress test  04/05/2010   Negative for ischemia        Home Medications    Prior to Admission medications   Medication Sig Start Date End Date Taking?  Authorizing Provider  amLODipine (NORVASC) 10 MG tablet TAKE ONE TABLET BY MOUTH DAILY 12/25/16   Troy Sine, MD  aspirin EC 81 MG tablet Take 81 mg by mouth daily.    [provider]  atorvastatin (LIPITOR) 10 MG tablet TAKE ONE TABLET BY MOUTH EVERY DAY AT SIX p.m. 04/25/17   Troy Sine, MD  Eloise Levels, Camillia sinensis, (GREEN TEA PO) Take by mouth. Cup of Tea daily.    [provider]  irbesartan (AVAPRO) 300 MG tablet Take 1 tablet (300 mg total) by mouth daily. 03/19/17   Troy Sine, MD  metoprolol tartrate (LOPRESSOR) 50 MG tablet TAKE ONE TABLET (50mg ) BY MOUTH EVERY MORNING & ONE-HALF TABLET (25mg ) BY MOUTH AT night 08/28/17   Troy Sine, MD  Misc Natural Products ([place]) Take 4 capsules by mouth daily.    [provider]  Multiple Vitamins-Minerals (MULTIVITAMIN) tablet Take 1 tablet by mouth daily.    [provider]  NON FORMULARY Spoon full of Flax Seeds daily.    [provider]  NON FORMULARY Blue Berry Extract daily    [provider]  omeprazole (PRILOSEC) 20 MG capsule Take 20 mg by mouth daily.    [provider]  spironolactone (ALDACTONE) 25 MG tablet Take 1 tablet (25 MG TOTAL) in the morning and 1/2 tablet (12.5 mg) in the evening 02/18/17   Troy Sine, MD  spironolactone (ALDACTONE) 25 MG tablet TAKE ONE TABLET BY MOUTH EVERY MORNING & ONE-HALF EVERY EVENING 07/23/17   Troy Sine, MD  tamsulosin (FLOMAX) 0.4 MG CAPS capsule TAKE 1 CAPSULE TWICE A DAY 03/11/15   Tyler Pita, MD  warfarin (COUMADIN) 5 MG tablet TAKE ONE TO ONE AND ONE-HALF TABLET BY MOUTH EVERY DAY AS DIRECTED by THE coumadin clinic 07/08/17   Troy Sine, MD    Family History No family history on file.  Social History Social History   Tobacco Use  . Smoking status: Current Every Day Smoker    Packs/day: 0.25    Types: Cigars  . Smokeless tobacco: Never Used  Substance Use Topics    . Alcohol use: Yes    Comment: rarely  . Drug use: Not on file     Allergies   Ace inhibitors   Review of Systems Review of Systems  All other systems reviewed and are negative.    Physical Exam Updated Vital Signs BP (!) 172/68   Pulse 68   Temp 98.2 F (36.8 C) (Oral)   Resp 18   SpO2 99%   Physical Exam  Constitutional: He is oriented to person, place, and time. He appears well-developed and well-nourished. No distress.  HENT:  Head: Normocephalic and atraumatic.  Right Ear: External ear normal.  Left Ear: External ear normal.  Mouth/Throat: Oropharynx is clear and moist.  Eyes: Conjunctivae are normal. Right eye exhibits no discharge. Left eye exhibits no discharge. No scleral icterus.  Neck: Neck supple. No tracheal deviation present.  Cardiovascular: Normal rate, regular rhythm and intact distal pulses.  Pulmonary/Chest: Effort normal and breath sounds normal. No stridor. No respiratory distress. He has no wheezes. He has no rales.  Abdominal: Soft. Bowel sounds are normal. He exhibits no distension. There is no tenderness. There is no rebound and no guarding.  Musculoskeletal: He exhibits no edema or tenderness.  Neurological: He is alert and oriented to person, place, and time. He has normal strength. No cranial nerve deficit (No facial droop, extraocular movements intact, tongue midline ) or sensory deficit. He exhibits normal muscle tone. He displays no seizure activity. Coordination normal.  No pronator drift bilateral upper extrem, able to hold both legs off bed for 5 seconds, sensation intact in all extremities, no visual field cuts, no left or right sided neglect, normal finger-nose exam bilaterally, no nystagmus noted   Skin: Skin is warm and dry. No rash noted.  Psychiatric: He has a normal mood and affect.  Nursing note and vitals reviewed.    ED Treatments / Results  Labs (all labs ordered are listed, but only abnormal results are displayed) Labs  Reviewed  BASIC METABOLIC PANEL - Abnormal; Notable for the following components:      Result Value   Glucose, Bld 104 (*)    Creatinine, Ser 1.34 (*)    GFR calc non Af Amer 49 (*)    GFR calc Af Amer 57 (*)    All other components within normal limits  PROTIME-INR - Abnormal; Notable for the following components:   Prothrombin Time 24.0 (*)    All other components within normal limits  CBC  URINALYSIS, ROUTINE W REFLEX MICROSCOPIC  CBG MONITORING, ED    EKG EKG Interpretation  Date/Time:  Sunday September 01 2017 13:16:18 EDT Ventricular Rate:  69 PR Interval:  136 QRS Duration: 90 QT Interval:  364 QTC Calculation: 390 R Axis:   44 Text Interpretation:  Sinus rhythm with Premature  supraventricular complexes Septal infarct , age undetermined Abnormal ECG No significant change since last tracing Confirmed by Dorie Rank [phone]) on 09/01/2017 1:53:16 PM   Radiology No results found.  Procedures Procedures (including critical care time)  Medications Ordered in ED Medications  meclizine (ANTIVERT) tablet 25 mg (25 mg Oral Given 09/01/17 1646)     Initial Impression / Assessment and Plan / ED Course  I have reviewed the triage vital signs and the nursing notes.  Pertinent labs & imaging results that were available during my care of the patient were reviewed by me and considered in my medical decision making (see chart for details).  Clinical Course as of Sep 01 1648  Sun Sep 01, 2017  1649 Initial labs are reassuring.  No clinically significant abnormalities   [JK]    Clinical Course User Index [JK] Dorie Rank, MD  Patient presented to emergency room with complaints of dizziness.  Some the features are suggestive of peripheral vertigo however the patient continues to have symptoms.  He is at increased risk for stroke.  We will proceed with MRI to evaluate for any acute abnormalities.  Dr Lita Mains will follow up on the MRI. If negative, would treat for peripheral  vertigo   Final Clinical Impressions(s) / ED Diagnoses  pending   Dorie Rank, MD 09/01/17 1650

## 2017-09-01 NOTE — ED Notes (Signed)
Pt going to MRI at this time.

## 2017-09-01 NOTE — ED Provider Notes (Signed)
Signed out from Dr. Tomi Bamberger pending MRI brain.  MRI brain without acute findings.  Given meclizine with mild improvement in his symptoms.  Patient does have some orthostasis which is likely contributing to his symptoms.  Given IV fluids.  While he is still having some dizziness especially with bending down he is able to ambulate without assistance.  Discussed hospitalization versus outpatient work-up with prescription for meclizine.  Patient has chosen to be discharged home.  Is been given strict return precautions and is voiced understanding.   Julianne Rice, MD 09/01/17 2159

## 2017-09-01 NOTE — ED Notes (Signed)
Pt ambulatory with steady gait. Pt states dizziness begins when he bends over or turn to quickly.

## 2017-09-03 ENCOUNTER — Other Ambulatory Visit: Payer: Self-pay

## 2017-09-03 ENCOUNTER — Emergency Department (HOSPITAL_COMMUNITY)
Admission: EM | Admit: 2017-09-03 | Discharge: 2017-09-03 | Disposition: A | Discharge status: Home / Self Care | Payer: Medicare Other | Attending: Emergency Medicine | Admitting: Emergency Medicine

## 2017-09-03 ENCOUNTER — Encounter (HOSPITAL_COMMUNITY): Payer: Self-pay

## 2017-09-03 DIAGNOSIS — I10 Essential (primary) hypertension: Secondary | ICD-10-CM | POA: Diagnosis not present

## 2017-09-03 DIAGNOSIS — Z7982 Long term (current) use of aspirin: Secondary | ICD-10-CM | POA: Diagnosis not present

## 2017-09-03 DIAGNOSIS — Z79899 Other long term (current) drug therapy: Secondary | ICD-10-CM | POA: Diagnosis not present

## 2017-09-03 DIAGNOSIS — Z7901 Long term (current) use of anticoagulants: Secondary | ICD-10-CM | POA: Diagnosis not present

## 2017-09-03 DIAGNOSIS — F17210 Nicotine dependence, cigarettes, uncomplicated: Secondary | ICD-10-CM | POA: Diagnosis not present

## 2017-09-03 DIAGNOSIS — R42 Dizziness and giddiness: Secondary | ICD-10-CM | POA: Insufficient documentation

## 2017-09-03 LAB — URINALYSIS, ROUTINE W REFLEX MICROSCOPIC
Bilirubin Urine: NEGATIVE
Glucose, UA: NEGATIVE mg/dL
HGB URINE DIPSTICK: NEGATIVE
KETONES UR: NEGATIVE mg/dL
Leukocytes, UA: NEGATIVE
Nitrite: NEGATIVE
PH: 5 (ref 5.0–8.0)
PROTEIN: NEGATIVE mg/dL
Specific Gravity, Urine: 1.02 (ref 1.005–1.030)

## 2017-09-03 LAB — BASIC METABOLIC PANEL
Anion gap: 7 (ref 5–15)
BUN: 17 mg/dL (ref 8–23)
CHLORIDE: 108 mmol/L (ref 98–111)
CO2: 27 mmol/L (ref 22–32)
CREATININE: 1.43 mg/dL — AB (ref 0.61–1.24)
Calcium: 9.5 mg/dL (ref 8.9–10.3)
GFR calc Af Amer: 53 mL/min — ABNORMAL LOW (ref >60)
GFR calc non Af Amer: 45 mL/min — ABNORMAL LOW (ref >60)
GLUCOSE: 156 mg/dL — AB (ref 70–99)
POTASSIUM: 4.5 mmol/L (ref 3.5–5.1)
Sodium: 142 mmol/L (ref 135–145)

## 2017-09-03 LAB — CBC
HEMATOCRIT: 41.1 % (ref 39.0–52.0)
Hemoglobin: 13.9 g/dL (ref 13.0–17.0)
MCH: 34.2 pg — AB (ref 26.0–34.0)
MCHC: 33.8 g/dL (ref 30.0–36.0)
MCV: 101 fL — AB (ref 78.0–100.0)
PLATELETS: 169 10*3/uL (ref 150–400)
RBC: 4.07 MIL/uL — ABNORMAL LOW (ref 4.22–5.81)
RDW: 12.4 % (ref 11.5–15.5)
WBC: 5.1 10*3/uL (ref 4.0–10.5)

## 2017-09-03 MED ORDER — DIAZEPAM 5 MG PO TABS: 5.0000 mg | ORAL_TABLET | Freq: Two times a day (BID) | ORAL | 0 refills | Status: DC

## 2017-09-03 NOTE — Discharge Instructions (Addendum)
Call Dr. Peggyann Juba for appointment as soon as possible Try Valium for dizziness instead of Meclizine if this is not helping you Please talk to your doctor about possible physical therapy for vertigo as well Try the Epley maneuver for vertigo - a printout has been provided on how to do this

## 2017-09-03 NOTE — ED Provider Notes (Signed)
[facility] EMERGENCY DEPARTMENT Provider Note   CSN: [id number] Arrival date & time: 09/03/17  1304     History   Chief Complaint Chief Complaint  Patient presents with  . Dizziness    HPI Pedro Kent is a 77 y.o. male who presents with dizziness. PMH significant for prostate cancer, hx of DVT on Warfarin, HTN, HLD. He states that he started having dizziness 3-4 days ago. It is worse with head movement and bending over and when he lies down. This will resolve once he is still for a little bit. He was seen in the ED and had a MRI of the brain which was negative for stroke. He was discharged with ENT follow up and Meclizine. He has been taking this without relief. He made an appointment with ENT but it's not until the 25th and he feels he cannot wait that long for relief. He denies headache, vision changes, chest pain, SOB, syncope.   HPI  Past Medical History:  Diagnosis Date  . Adrenal hyperplasia ([place])   . Cancer [facility]) 2010   prostate cancer  . DVT (deep venous thrombosis) ([place])   . Hyperlipidemia   . Hypertension     Patient Active Problem List   Diagnosis Date Noted  . Anxiety 06/30/2015  . Tobacco use 12/21/2013  . History of prostate cancer 10/27/2012  . ECG abnormality 10/27/2012  . Long term current use of anticoagulant therapy 05/08/2012  . DVT (deep venous thrombosis), unspecified laterality 05/08/2012  . Essential hypertension 02/04/2012    Class: Diagnosis of  . Hyperlipidemia 02/04/2012    Class: History of    Past Surgical History:  Procedure Laterality Date  . 2-D echocardiogram  03/10/2009   Normal left ventricular systolic function. Mild MR. Normal RV systolic pressure. Mild pulmonic valvular regurgitation.  . Cardiac stress test  04/05/2010   Negative for ischemia        Home Medications    Prior to Admission medications   Medication Sig Start Date End Date Taking? Authorizing Provider  amLODipine (NORVASC) 10 MG  tablet TAKE ONE TABLET BY MOUTH DAILY 12/25/16  Yes Troy Sine, MD  aspirin EC 81 MG tablet Take 81 mg by mouth daily.   Yes [provider]  atorvastatin (LIPITOR) 10 MG tablet TAKE ONE TABLET BY MOUTH EVERY DAY AT SIX p.m. 04/25/17  Yes Troy Sine, MD  Green Tea, Camillia sinensis, (GREEN TEA PO) Take by mouth. Cup of Tea daily.   Yes [provider]  irbesartan (AVAPRO) 300 MG tablet Take 1 tablet (300 mg total) by mouth daily. 03/19/17  Yes Troy Sine, MD  meclizine (ANTIVERT) 25 MG tablet Take 1 tablet (25 mg total) by mouth 3 (three) times daily as needed for dizziness. 09/01/17  Yes Julianne Rice, MD  metoprolol tartrate (LOPRESSOR) 50 MG tablet TAKE ONE TABLET (50mg ) BY MOUTH EVERY MORNING & ONE-HALF TABLET (25mg ) BY MOUTH AT night 08/28/17  Yes Troy Sine, MD  Misc Natural Products ([place]) Take 4 capsules by mouth daily.   Yes [provider]  Multiple Vitamins-Minerals (MULTIVITAMIN) tablet Take 1 tablet by mouth daily.   Yes [provider]  omeprazole (PRILOSEC) 20 MG capsule Take 20 mg by mouth daily as needed (heartburn).    Yes [provider]  spironolactone (ALDACTONE) 25 MG tablet Take 1 tablet (25 MG TOTAL) in the morning and 1/2 tablet (12.5 mg) in the evening 02/18/17  Yes Claiborne Billings,  Joyice Faster, MD  tamsulosin (FLOMAX) 0.4 MG CAPS capsule TAKE 1 CAPSULE TWICE A DAY Patient taking differently: Take 0.4mg  oral daily 03/11/15  Yes Tyler Pita, MD  warfarin (COUMADIN) 5 MG tablet TAKE ONE TO ONE AND ONE-HALF TABLET BY MOUTH EVERY DAY AS DIRECTED by THE coumadin clinic Patient taking differently: Take 5-7.5 mg by mouth See admin instructions. Take 5 mg on M-W-F, Take 7.5 mg on all other days 07/08/17  Yes Troy Sine, MD  spironolactone (ALDACTONE) 25 MG tablet TAKE ONE TABLET BY MOUTH EVERY MORNING & ONE-HALF EVERY EVENING Patient not taking: Reported on 09/01/2017 07/23/17   Troy Sine, MD     Family History History reviewed. No pertinent family history.  Social History Social History   Tobacco Use  . Smoking status: Current Every Day Smoker    Packs/day: 0.25    Types: Cigars  . Smokeless tobacco: Never Used  Substance Use Topics  . Alcohol use: Yes    Comment: rarely  . Drug use: Not on file     Allergies   Ace inhibitors   Review of Systems Review of Systems  Constitutional: Negative for fever.  Musculoskeletal: Negative for neck pain.  Neurological: Positive for dizziness. Negative for syncope, speech difficulty and headaches.  All other systems reviewed and are negative.    Physical Exam Updated Vital Signs BP 138/83   Pulse 67   Temp 97.8 F (36.6 C) (Oral)   Resp 17   Ht 6' (1.829 m)   Wt 93 kg (205 lb)   SpO2 100%   BMI 27.80 kg/m   Physical Exam  Constitutional: He is oriented to person, place, and time. He appears well-developed and well-nourished. No distress.  Elderly male in NAD. Reported dizziness with head maneuvers  HENT:  Head: Normocephalic and atraumatic.  Eyes: Pupils are equal, round, and reactive to light. Conjunctivae are normal. Right eye exhibits no discharge. Left eye exhibits no discharge. No scleral icterus.  Neck: Normal range of motion.  Cardiovascular: Normal rate and regular rhythm.  Pulmonary/Chest: Effort normal and breath sounds normal. No respiratory distress.  Abdominal: He exhibits no distension.  Neurological: He is alert and oriented to person, place, and time.  Lying on stretcher in NAD. GCS 15. Speaks in a clear voice. Cranial nerves II through XII grossly intact. 5/5 strength in all extremities. Sensation fully intact.  Bilateral finger-to-nose intact. Ambulatory   Skin: Skin is warm and dry.  Psychiatric: He has a normal mood and affect. His behavior is normal.  Nursing note and vitals reviewed.    ED Treatments / Results  Labs (all labs ordered are listed, but only abnormal results are  displayed) Labs Reviewed  BASIC METABOLIC PANEL - Abnormal; Notable for the following components:      Result Value   Glucose, Bld 156 (*)    Creatinine, Ser 1.43 (*)    GFR calc non Af Amer 45 (*)    GFR calc Af Amer 53 (*)    All other components within normal limits  CBC - Abnormal; Notable for the following components:   RBC 4.07 (*)    MCV 101.0 (*)    MCH 34.2 (*)    All other components within normal limits  URINALYSIS, ROUTINE W REFLEX MICROSCOPIC  CBG MONITORING, ED    EKG None  Radiology No results found.  Procedures Procedures (including critical care time)  Medications Ordered in ED Medications - No data to display   Initial Impression /  Assessment and Plan / ED Course  I have reviewed the triage vital signs and the nursing notes.  Pertinent labs & imaging results that were available during my care of the patient were reviewed by me and considered in my medical decision making (see chart for details).  77 year old male presents with persistent and intermittent dizziness for the past couple days. He is hypertensive but otherwise vitals are normal. He has a normal neurologic exam and negative MRI a couple days ago. Blood work is unchanged from several days ago as well. He hasn't had relief with Meclizine. I attempted Epley maneuver at bedside and showed him how he can do this at home. He did get dizzy during the maneuver and it quickly resolved after he kept his head still. We will try Valium as an alternative and he was given a referral to Dr. Janace Hoard. Hopefully he can get in with him sooner. He also has a PCP follow up in two days.  Final Clinical Impressions(s) / ED Diagnoses   Final diagnoses:  Vertigo    ED Discharge Orders    None       Recardo Evangelist, PA-C 09/03/17 2346    Dorie Rank, MD 09/04/17 1538

## 2017-09-03 NOTE — ED Triage Notes (Signed)
Pt states he was seen here on Sunday for dizziness. He was given meclizine and told to follow up with neurology. Pt states he has had no improvement but today after bending down he felt off balance. Pt aoX4.

## 2017-09-03 NOTE — ED Provider Notes (Signed)
Patient placed in Quick Look pathway, seen and evaluated   Chief Complaint: Dizziness  HPI:   Patient presents after being evaluated 2 days ago with worsening dizziness. Its is worse with head movement. He bent over earlier and felt very off balance. He denies vision change. He had MRI and workup 2 days ago and determined to have peripheral vertigo and referred to ENT and PCP.   ROS: Dizziness  Physical Exam:   Gen: No distress  Neuro: Awake and Alert  Skin: Warm    Focused Exam: CN 3-12 intact; normal sensation throughout; 5/5 strength in all 4 extremities; equal bilateral grip strength; lungs CTA, heart normal RR   Initiation of care has begun. The patient has been counseled on the process, plan, and necessity for staying for the completion/evaluation, and the remainder of the medical screening examination    Frederica Kuster, PA-C 09/03/17 1410    Drenda Freeze, MD 09/05/17 1729

## 2017-09-05 DIAGNOSIS — H8110 Benign paroxysmal vertigo, unspecified ear: Secondary | ICD-10-CM | POA: Diagnosis not present

## 2017-09-05 DIAGNOSIS — N400 Enlarged prostate without lower urinary tract symptoms: Secondary | ICD-10-CM | POA: Diagnosis not present

## 2017-09-05 DIAGNOSIS — I10 Essential (primary) hypertension: Secondary | ICD-10-CM | POA: Diagnosis not present

## 2017-09-05 DIAGNOSIS — I4891 Unspecified atrial fibrillation: Secondary | ICD-10-CM | POA: Diagnosis not present

## 2017-09-05 DIAGNOSIS — E7800 Pure hypercholesterolemia, unspecified: Secondary | ICD-10-CM | POA: Diagnosis not present

## 2017-09-11 DIAGNOSIS — Z0000 Encounter for general adult medical examination without abnormal findings: Secondary | ICD-10-CM | POA: Diagnosis not present

## 2017-09-11 DIAGNOSIS — I4891 Unspecified atrial fibrillation: Secondary | ICD-10-CM | POA: Diagnosis not present

## 2017-09-11 DIAGNOSIS — I10 Essential (primary) hypertension: Secondary | ICD-10-CM | POA: Diagnosis not present

## 2017-09-11 DIAGNOSIS — F419 Anxiety disorder, unspecified: Secondary | ICD-10-CM | POA: Diagnosis not present

## 2017-09-12 DIAGNOSIS — H8110 Benign paroxysmal vertigo, unspecified ear: Secondary | ICD-10-CM | POA: Diagnosis not present

## 2017-09-18 ENCOUNTER — Ambulatory Visit: Payer: Medicare Other | Admitting: Pharmacist Clinician (PhC)/ Clinical Pharmacy Specialist

## 2017-09-18 DIAGNOSIS — Z7901 Long term (current) use of anticoagulants: Secondary | ICD-10-CM | POA: Diagnosis not present

## 2017-09-18 DIAGNOSIS — I824Y9 Acute embolism and thrombosis of unspecified deep veins of unspecified proximal lower extremity: Secondary | ICD-10-CM

## 2017-09-18 LAB — POCT INR: INR: 3.2 — AB (ref 2.0–3.0)

## 2017-09-18 MED ORDER — APIXABAN 5 MG PO TABS: 5.0000 mg | ORAL_TABLET | Freq: Two times a day (BID) | ORAL | 5 refills | Status: DC

## 2017-09-18 NOTE — Patient Instructions (Signed)
Description   Stop warfarin as of now.  Do not take any tonight.  Start the Eliquis 5 mg on Thursday EVENING.

## 2017-09-25 DIAGNOSIS — F419 Anxiety disorder, unspecified: Secondary | ICD-10-CM | POA: Diagnosis not present

## 2017-10-02 DIAGNOSIS — R42 Dizziness and giddiness: Secondary | ICD-10-CM | POA: Diagnosis not present

## 2017-10-02 DIAGNOSIS — F419 Anxiety disorder, unspecified: Secondary | ICD-10-CM | POA: Diagnosis not present

## 2017-10-08 ENCOUNTER — Encounter (HOSPITAL_COMMUNITY): Payer: Self-pay | Admitting: Student

## 2017-10-08 ENCOUNTER — Emergency Department (HOSPITAL_COMMUNITY)
Admission: EM | Admit: 2017-10-08 | Discharge: 2017-10-08 | Disposition: A | Discharge status: Home / Self Care | Payer: Medicare Other | Attending: Emergency Medicine | Admitting: Emergency Medicine

## 2017-10-08 DIAGNOSIS — F411 Generalized anxiety disorder: Secondary | ICD-10-CM | POA: Diagnosis not present

## 2017-10-08 DIAGNOSIS — Z7901 Long term (current) use of anticoagulants: Secondary | ICD-10-CM | POA: Insufficient documentation

## 2017-10-08 DIAGNOSIS — I10 Essential (primary) hypertension: Secondary | ICD-10-CM | POA: Diagnosis not present

## 2017-10-08 DIAGNOSIS — F329 Major depressive disorder, single episode, unspecified: Secondary | ICD-10-CM | POA: Insufficient documentation

## 2017-10-08 DIAGNOSIS — R457 State of emotional shock and stress, unspecified: Secondary | ICD-10-CM | POA: Diagnosis not present

## 2017-10-08 DIAGNOSIS — Z7982 Long term (current) use of aspirin: Secondary | ICD-10-CM | POA: Diagnosis not present

## 2017-10-08 DIAGNOSIS — F17210 Nicotine dependence, cigarettes, uncomplicated: Secondary | ICD-10-CM | POA: Diagnosis not present

## 2017-10-08 DIAGNOSIS — Z79899 Other long term (current) drug therapy: Secondary | ICD-10-CM | POA: Insufficient documentation

## 2017-10-08 DIAGNOSIS — F32A Depression, unspecified: Secondary | ICD-10-CM

## 2017-10-08 LAB — CBC
HCT: 39.2 % (ref 39.0–52.0)
HEMOGLOBIN: 14.2 g/dL (ref 13.0–17.0)
MCH: 34.5 pg — AB (ref 26.0–34.0)
MCHC: 36.2 g/dL — AB (ref 30.0–36.0)
MCV: 95.4 fL (ref 78.0–100.0)
Platelets: 172 10*3/uL (ref 150–400)
RBC: 4.11 MIL/uL — AB (ref 4.22–5.81)
RDW: 12.3 % (ref 11.5–15.5)
WBC: 4.5 10*3/uL (ref 4.0–10.5)

## 2017-10-08 LAB — ETHANOL: Alcohol, Ethyl (B): <10 mg/dL (ref <10)

## 2017-10-08 LAB — COMPREHENSIVE METABOLIC PANEL
ALBUMIN: 4.3 g/dL (ref 3.5–5.0)
ALK PHOS: 59 U/L (ref 38–126)
ALT: 27 U/L (ref 0–44)
ANION GAP: 10 (ref 5–15)
AST: 22 U/L (ref 15–41)
BILIRUBIN TOTAL: 1.6 mg/dL — AB (ref 0.3–1.2)
BUN: 18 mg/dL (ref 8–23)
CALCIUM: 9.3 mg/dL (ref 8.9–10.3)
CO2: 25 mmol/L (ref 22–32)
CREATININE: 1.25 mg/dL — AB (ref 0.61–1.24)
Chloride: 100 mmol/L (ref 98–111)
GFR calc Af Amer: >60 mL/min (ref >60)
GFR calc non Af Amer: 53 mL/min — ABNORMAL LOW (ref >60)
GLUCOSE: 112 mg/dL — AB (ref 70–99)
Potassium: 3.8 mmol/L (ref 3.5–5.1)
Sodium: 135 mmol/L (ref 135–145)
TOTAL PROTEIN: 7.1 g/dL (ref 6.5–8.1)

## 2017-10-08 MED ORDER — PANTOPRAZOLE SODIUM 40 MG PO TBEC: 40.0000 mg | DELAYED_RELEASE_TABLET | Freq: Every day | ORAL | Status: DC

## 2017-10-08 MED ORDER — METOPROLOL TARTRATE 25 MG PO TABS: 50.0000 mg | ORAL_TABLET | Freq: Two times a day (BID) | ORAL | Status: DC

## 2017-10-08 MED ORDER — MIRTAZAPINE 7.5 MG PO TABS: 7.5000 mg | ORAL_TABLET | Freq: Every day | ORAL | 0 refills | Status: DC

## 2017-10-08 MED ORDER — AMLODIPINE BESYLATE 5 MG PO TABS: 10.0000 mg | ORAL_TABLET | Freq: Every day | ORAL | Status: DC

## 2017-10-08 MED ORDER — ATORVASTATIN CALCIUM 10 MG PO TABS: 10.0000 mg | ORAL_TABLET | Freq: Every day | ORAL | Status: DC

## 2017-10-08 MED ORDER — IRBESARTAN 300 MG PO TABS: 300.0000 mg | ORAL_TABLET | Freq: Every day | ORAL | Status: DC

## 2017-10-08 MED ORDER — SPIRONOLACTONE 25 MG PO TABS: 25.0000 mg | ORAL_TABLET | Freq: Two times a day (BID) | ORAL | Status: DC

## 2017-10-08 MED ORDER — DIAZEPAM 5 MG PO TABS: 5.0000 mg | ORAL_TABLET | Freq: Two times a day (BID) | ORAL | Status: DC

## 2017-10-08 MED ORDER — APIXABAN 5 MG PO TABS: 5.0000 mg | ORAL_TABLET | Freq: Two times a day (BID) | ORAL | Status: DC

## 2017-10-08 MED ORDER — THERA VITAL M PO TABS: 1.0000 | ORAL_TABLET | Freq: Every day | ORAL | Status: DC

## 2017-10-08 MED ORDER — TAMSULOSIN HCL 0.4 MG PO CAPS: 0.4000 mg | ORAL_CAPSULE | Freq: Every day | ORAL | Status: DC

## 2017-10-08 MED ORDER — MECLIZINE HCL 25 MG PO TABS: 25.0000 mg | ORAL_TABLET | Freq: Three times a day (TID) | ORAL | Status: DC | PRN

## 2017-10-08 NOTE — Discharge Instructions (Addendum)
For your behavioral health needs, you are advised to continue treatment with Janie Morning, DO, your primary care provider:       Janie Morning, DO      [address] [place], [place] [address]      [phone]  Please discontinue your use of Celexa and start the new medication prescribed by the psychiatrist you saw here in the emergency department.  We are sending you home with prescription for Remeron.  Please have this filled today.  We have prescribed you new medication(s) today. Discuss the medications prescribed today with your pharmacist as they can have adverse effects and interactions with your other medicines including over the counter and prescribed medications. Seek medical evaluation if you start to experience new or abnormal symptoms after taking one of these medicines, seek care immediately if you start to experience difficulty breathing, feeling of your throat closing, facial swelling, or rash as these could be indications of a more serious allergic reaction   Please follow-up with your primary care provider within 3 days for reevaluation.  Return to the ER anytime for new or worsening symptoms including but not limited to worsening depression or anxiety, thoughts, homicidal thoughts, hallucinations, or any other concerns.   Additionally please be sure to take your blood pressure medication as prescribed as her blood pressure was elevated in the emergency department today.

## 2017-10-08 NOTE — ED Triage Notes (Signed)
Transported by [facility] from home--hx of depression and anxiety, recently prescribed on Celexa and states that he has been experiencing highs and lows. Has been depressed for 2 days. Denies SI / HI.

## 2017-10-08 NOTE — BH Assessment (Signed)
Assessment Note  Pedro Kent is a 77 y.o. male in [place] due to adverse effects of psychiatric meds prescribed by his PCP. Pt reports that he's been seeing this doctor for @ a month. He has been prescribed a total of 3 medications to address his anxiety and reported that "neither one of them solved the problem". The first 2 meds, he only took for 4 days and this last one (Celexa), he's been taking for @ 7 days. Pt reports that since taking the various meds, he's been feeling more anxious and feeling like he was dying. Pt couldn't explain this sentiment fully. Pt denied that he physically felt that he wanted to die or that he felt paranoid about dying. Additionally, pt reports that he's been feeling "deep depression" sometimes at night since taking the various meds. Pt denies having any thoughts of suicide, homicide, or hallucinations.Pt has no prior psychiatric hx. Clinician discussed w/ pt the average length of time it takes for a psych med to be fully absorbed into the system (4-6 weeks).  Case staffed with Dr. Leilani Kent, who agreed that pt does not need psychiatric hospitalization. Dr. Mariea Kent provided pt with a 14 day rx for Remeron with instruction to d/c his Celexa. Disposition information given to Pedro Kent and PA, Pedro Kent.   Diagnosis: F41.1 GAD  Past Medical History:  Past Medical History:  Diagnosis Date  . Adrenal hyperplasia ([place])   . Cancer [facility]) 2010   prostate cancer  . DVT (deep venous thrombosis) ([place])   . Hyperlipidemia   . Hypertension     Past Surgical History:  Procedure Laterality Date  . 2-D echocardiogram  03/10/2009   Normal left ventricular systolic function. Mild MR. Normal RV systolic pressure. Mild pulmonic valvular regurgitation.  . Cardiac stress test  04/05/2010   Negative for ischemia    Family History: History reviewed. No pertinent family history.  Social History:  reports that he has been smoking cigars. He has been smoking about  0.25 packs per day. He has never used smokeless tobacco. He reports that he drinks alcohol. His drug history is not on file.  Additional Social History:  Alcohol / Drug Use Pain Medications: see MAR Prescriptions: see MAR Over the Counter: see MAR History of alcohol / drug use?: No history of alcohol / drug abuse  CIWA: CIWA-Ar BP: (!) 169/87 Pulse Rate: 88 COWS:    Allergies:  Allergies  Allergen Reactions  . Ace Inhibitors Other (See Comments)    Unknown    Home Medications:  (Not in a hospital admission)  OB/GYN Status:  No LMP for male patient.  General Assessment Data Location of Assessment: [facility] ED TTS Assessment: In system Is this a Tele or Face-to-Face Assessment?: Face-to-Face Is this an Initial Assessment or a Re-assessment for this encounter?: Initial Assessment Marital status: Single Living Arrangements: Alone Can pt return to current living arrangement?: Yes Admission Status: Voluntary Is patient capable of signing voluntary admission?: Yes Referral Source: Self/Family/Friend Insurance type: [place] Living Arrangements: Alone Name of Psychiatrist: none Name of Therapist: none  Education Status Is patient currently in school?: No Is the patient employed, unemployed or receiving disability?: Unemployed(Retired)  Risk to self with the past 6 months Suicidal Ideation: No Has patient been a risk to self within the past 6 months prior to admission? : No Suicidal Intent: No Has patient had any suicidal intent within the past 6 months prior to admission? : No Is  patient at risk for suicide?: No Suicidal Plan?: No Has patient had any suicidal plan within the past 6 months prior to admission? : No Access to Means: No Previous Attempts/Gestures: No Intentional Self Injurious Behavior: None Family Suicide History: No Recent stressful life event(s): Recent negative physical changes Persecutory voices/beliefs?: No Depression:  Yes Substance abuse history and/or treatment for substance abuse?: No Suicide prevention information given to non-admitted patients: Not applicable  Risk to Others within the past 6 months Homicidal Ideation: No Does patient have any lifetime risk of violence toward others beyond the six months prior to admission? : No Thoughts of Harm to Others: No Current Homicidal Intent: No Current Homicidal Plan: No Access to Homicidal Means: No History of harm to others?: No Assessment of Violence: None Noted Does patient have access to weapons?: No Criminal Charges Pending?: No Does patient have a court date: No Is patient on probation?: No  Psychosis Hallucinations: None noted Delusions: None noted  Mental Status Report Appearance/Hygiene: Unremarkable Eye Contact: Good Motor Activity: Unremarkable Speech: Logical/coherent Level of Consciousness: Alert Mood: Pleasant Affect: Blunted Anxiety Level: Minimal Thought Processes: Coherent, Relevant Judgement: Unimpaired Orientation: Person, Place, Time, Situation Obsessive Compulsive Thoughts/Behaviors: None  Cognitive Functioning Concentration: Normal Memory: Recent Intact, Remote Intact Is patient IDD: No Is patient DD?: No Insight: Fair Impulse Control: Good Appetite: Fair Have you had any weight changes? : No Change Sleep: Decreased Total Hours of Sleep: 2 Vegetative Symptoms: None  ADLScreening [facility] Assessment Services) Patient's cognitive ability adequate to safely complete daily activities?: Yes Patient able to express need for assistance with ADLs?: Yes Independently performs ADLs?: Yes (appropriate for developmental age)  Prior Inpatient Therapy Prior Inpatient Therapy: No  Prior Outpatient Therapy Prior Outpatient Therapy: No Does patient have an ACCT team?: No Does patient have Intensive In-House Services?  : No Does patient have [facility] services? : No Does patient have P4CC services?: No  ADL Screening  (condition at time of admission) Patient's cognitive ability adequate to safely complete daily activities?: Yes Is the patient deaf or have difficulty hearing?: No Does the patient have difficulty seeing, even when wearing glasses/contacts?: No Does the patient have difficulty concentrating, remembering, or making decisions?: No Patient able to express need for assistance with ADLs?: Yes Does the patient have difficulty dressing or bathing?: No Independently performs ADLs?: Yes (appropriate for developmental age) Does the patient have difficulty walking or climbing stairs?: No Weakness of Legs: None Weakness of Arms/Hands: None  Home Assistive Devices/Equipment Home Assistive Devices/Equipment: None    Abuse/Neglect Assessment (Assessment to be complete while patient is alone) Abuse/Neglect Assessment Can Be Completed: Yes Physical Abuse: Denies Verbal Abuse: Denies Sexual Abuse: Denies Exploitation of patient/patient's resources: Denies Self-Neglect: Denies     Regulatory affairs officer (For Healthcare) Does Patient Have a Medical Advance Directive?: No Nutrition Screen- [place] Adult/[facility]/AP Patient's home diet: Regular        Disposition:  Disposition Initial Assessment Completed for this Encounter: Yes  On Site Evaluation by:   Reviewed with Physician:    Rexene Edison 10/08/2017 10:43 AM

## 2017-10-08 NOTE — ED Provider Notes (Signed)
[facility] DEPT Provider Note   CSN: [id number] Arrival date & time: 10/08/17  0846     History   Chief Complaint Chief Complaint  Patient presents with  . Depression    HPI Pedro Kent is a 77 y.o. male with a hx of HTN, hyperlipidemia, DVT (anticoagulated on Eliquis) tobacco abuse, prostate cancer, adrenal hyperplasia, and anxiety who presents to the ED via EMS with complaints of depression over the past week or so.  Patient states he was having problems with anxiety, his primary care provider, Dr. Theda Sers, started him on citalopram 10 mg on 10/02/17, he has been taking this as prescribed.  He states he feels he has good days and bad days, however recently they have all been bad.  He states he has had anxiety as well as "deep depression".  He states that at times he feels like he is going to die from the depression but has not had thoughts of self-harm.  No specific alleviating or aggravating factors.  Denies HI or hallucinations.  Denies fevers, chest pain, trouble breathing, or abdominal pain.  Of note he has not had any of his medications this morning including his type anxiety medicine or his antihypertensive medicine.  HPI  Past Medical History:  Diagnosis Date  . Adrenal hyperplasia ([place])   . Cancer [facility]) 2010   prostate cancer  . DVT (deep venous thrombosis) ([place])   . Hyperlipidemia   . Hypertension     Patient Active Problem List   Diagnosis Date Noted  . Anxiety 06/30/2015  . Tobacco use 12/21/2013  . History of prostate cancer 10/27/2012  . ECG abnormality 10/27/2012  . Essential hypertension 02/04/2012    Class: Diagnosis of  . Hyperlipidemia 02/04/2012    Class: History of    Past Surgical History:  Procedure Laterality Date  . 2-D echocardiogram  03/10/2009   Normal left ventricular systolic function. Mild MR. Normal RV systolic pressure. Mild pulmonic valvular regurgitation.  . Cardiac stress test  04/05/2010   Negative for ischemia        Home Medications    Prior to Admission medications   Medication Sig Start Date End Date Taking? Authorizing Provider  amLODipine (NORVASC) 10 MG tablet TAKE ONE TABLET BY MOUTH DAILY 12/25/16   Troy Sine, MD  apixaban (ELIQUIS) 5 MG TABS tablet Take 1 tablet (5 mg total) by mouth 2 (two) times daily. 09/18/17   Troy Sine, MD  aspirin EC 81 MG tablet Take 81 mg by mouth daily.    [provider]  atorvastatin (LIPITOR) 10 MG tablet TAKE ONE TABLET BY MOUTH EVERY DAY AT SIX p.m. 04/25/17   Troy Sine, MD  diazepam (VALIUM) 5 MG tablet Take 1 tablet (5 mg total) by mouth 2 (two) times daily. 09/03/17   Recardo Evangelist, PA-C  Green Tea, Camillia sinensis, (GREEN TEA PO) Take by mouth. Cup of Tea daily.    [provider]  irbesartan (AVAPRO) 300 MG tablet Take 1 tablet (300 mg total) by mouth daily. 03/19/17   Troy Sine, MD  meclizine (ANTIVERT) 25 MG tablet Take 1 tablet (25 mg total) by mouth 3 (three) times daily as needed for dizziness. 09/01/17   Julianne Rice, MD  metoprolol tartrate (LOPRESSOR) 50 MG tablet TAKE ONE TABLET (50mg ) BY MOUTH EVERY MORNING & ONE-HALF TABLET (25mg ) BY MOUTH AT night 08/28/17   Troy Sine, MD  Misc Natural Products ([place]) Take  4 capsules by mouth daily.    [provider]  Multiple Vitamins-Minerals (MULTIVITAMIN) tablet Take 1 tablet by mouth daily.    [provider]  omeprazole (PRILOSEC) 20 MG capsule Take 20 mg by mouth daily as needed (heartburn).     [provider]  spironolactone (ALDACTONE) 25 MG tablet Take 1 tablet (25 MG TOTAL) in the morning and 1/2 tablet (12.5 mg) in the evening 02/18/17   Troy Sine, MD  spironolactone (ALDACTONE) 25 MG tablet TAKE ONE TABLET BY MOUTH EVERY MORNING & ONE-HALF EVERY EVENING Patient not taking: Reported on 09/01/2017 07/23/17   Troy Sine, MD  tamsulosin (FLOMAX) 0.4 MG CAPS  capsule TAKE 1 CAPSULE TWICE A DAY Patient taking differently: Take 0.4mg  oral daily 03/11/15   Tyler Pita, MD    Family History No family history on file.  Social History Social History   Tobacco Use  . Smoking status: Current Every Day Smoker    Packs/day: 0.25    Types: Cigars  . Smokeless tobacco: Never Used  Substance Use Topics  . Alcohol use: Yes    Comment: rarely  . Drug use: Not on file     Allergies   Ace inhibitors   Review of Systems Review of Systems  Constitutional: Negative for chills and fever.  Eyes: Negative for visual disturbance.  Respiratory: Negative for shortness of breath.   Cardiovascular: Negative for chest pain.  Gastrointestinal: Negative for abdominal pain and vomiting.  Neurological: Negative for weakness, numbness and headaches.  Psychiatric/Behavioral: Negative for hallucinations and suicidal ideas. The patient is nervous/anxious.        Positive for depression.   All other systems reviewed and are negative.  Physical Exam Updated Vital Signs BP (!) 173/82   Pulse 73   Temp 98.2 F (36.8 C) (Oral)   Resp 18   SpO2 100%   Physical Exam  Constitutional: He appears well-developed and well-nourished.  Non-toxic appearance. No distress.  HENT:  Head: Normocephalic and atraumatic.  Eyes: Conjunctivae are normal. Right eye exhibits no discharge. Left eye exhibits no discharge.  Neck: Neck supple.  Cardiovascular: Normal rate and regular rhythm.  Pulmonary/Chest: Effort normal and breath sounds normal. No respiratory distress. He has no wheezes. He has no rhonchi. He has no rales.  Respiration even and unlabored  Abdominal: Soft. He exhibits no distension. There is no tenderness.  Neurological: He is alert.  Clear speech.   Skin: Skin is warm and dry. No rash noted.  Psychiatric: He has a normal mood and affect. His behavior is normal. He is not actively hallucinating. He expresses no homicidal and no suicidal ideation. He  expresses no suicidal plans and no homicidal plans.  Nursing note and vitals reviewed.    ED Treatments / Results  Labs (all labs ordered are listed, but only abnormal results are displayed) Labs Reviewed  CBC - Abnormal; Notable for the following components:      Result Value   RBC 4.11 (*)    MCH 34.5 (*)    MCHC 36.2 (*)    All other components within normal limits  COMPREHENSIVE METABOLIC PANEL - Abnormal; Notable for the following components:   Glucose, Bld 112 (*)    Creatinine, Ser 1.25 (*)    Total Bilirubin 1.6 (*)    GFR calc non Af Amer 53 (*)    All other components within normal limits  ETHANOL  RAPID URINE DRUG SCREEN, HOSP PERFORMED    EKG None  Radiology  No results found.  Procedures Procedures (including critical care time)  Medications Ordered in ED Medications - No data to display   Initial Impression / Assessment and Plan / ED Course  I have reviewed the triage vital signs and the nursing notes.  Pertinent labs & imaging results that were available during my care of the patient were reviewed by me and considered in my medical decision making (see chart for details).   Patient presents to the emergency department for depression and mood swings with recent initiation of Celexa just under 1 week prior as an antianxiety medication..  Patient has not had suicidal ideation, homicidal ideation, or hallucinations.  Patient has a benign physical exam.  His blood pressure is elevated in the setting of not having his antihypertensive medications this morning, doubt HTN emergency, discussed need for med compliance and PCP recheck.  His labs appear at baseline including elevated creatinine.  Patient was seen by behavioral health, recommendations for discontinuing Celexa, started on Remeron, prescription provided by psychiatrist Dr. Buford Dresser.  Patient appears hemodynamically stable and safe for discharge home, will have him follow-up closely with his primary  care provider.  Strict return precautions given.  Provided opportunity for questions, patient confirmed understanding and is in agreement with plan.  Findings and plan of care discussed with supervising physician Dr. Maryan Rued who personally evaluated and examined this patient and is in agreement with plan.    Final Clinical Impressions(s) / ED Diagnoses   Final diagnoses:  Depression, unspecified depression type    ED Discharge Orders         Ordered    mirtazapine (REMERON) 7.5 MG tablet  Daily at bedtime     10/08/17 [address] [place], PA-C 10/08/17 1108    Blanchie Dessert, MD 10/08/17 2102

## 2017-10-08 NOTE — ED Notes (Signed)
TTS at bedside. 

## 2017-10-08 NOTE — ED Notes (Signed)
Bed: [facility] Expected date:  Expected time:  Means of arrival:  Comments: 77 yo male depression

## 2017-10-14 DIAGNOSIS — F418 Other specified anxiety disorders: Secondary | ICD-10-CM | POA: Diagnosis not present

## 2017-10-14 DIAGNOSIS — G4700 Insomnia, unspecified: Secondary | ICD-10-CM | POA: Diagnosis not present

## 2017-10-16 DIAGNOSIS — F418 Other specified anxiety disorders: Secondary | ICD-10-CM | POA: Diagnosis not present

## 2017-10-23 DIAGNOSIS — G4700 Insomnia, unspecified: Secondary | ICD-10-CM | POA: Diagnosis not present

## 2017-10-23 DIAGNOSIS — F418 Other specified anxiety disorders: Secondary | ICD-10-CM | POA: Diagnosis not present

## 2017-10-28 ENCOUNTER — Other Ambulatory Visit: Payer: Self-pay | Admitting: Cardiovascular Disease

## 2017-10-30 DIAGNOSIS — G4700 Insomnia, unspecified: Secondary | ICD-10-CM | POA: Diagnosis not present

## 2017-10-30 DIAGNOSIS — F418 Other specified anxiety disorders: Secondary | ICD-10-CM | POA: Diagnosis not present

## 2017-11-14 DIAGNOSIS — G4700 Insomnia, unspecified: Secondary | ICD-10-CM | POA: Diagnosis not present

## 2017-11-14 DIAGNOSIS — R143 Flatulence: Secondary | ICD-10-CM | POA: Diagnosis not present

## 2017-11-14 DIAGNOSIS — F419 Anxiety disorder, unspecified: Secondary | ICD-10-CM | POA: Diagnosis not present

## 2017-11-28 DIAGNOSIS — F419 Anxiety disorder, unspecified: Secondary | ICD-10-CM | POA: Diagnosis not present

## 2017-12-11 DIAGNOSIS — F418 Other specified anxiety disorders: Secondary | ICD-10-CM | POA: Diagnosis not present

## 2017-12-11 DIAGNOSIS — I10 Essential (primary) hypertension: Secondary | ICD-10-CM | POA: Diagnosis not present

## 2017-12-29 ENCOUNTER — Other Ambulatory Visit: Payer: Self-pay | Admitting: Cardiovascular Disease

## 2017-12-30 ENCOUNTER — Telehealth: Payer: Self-pay | Admitting: Cardiovascular Disease

## 2017-12-30 NOTE — Telephone Encounter (Signed)
New message  Pt c/o medication issue:  1. Name of Medication: irbesartan (AVAPRO) 300 MG tablet  2. How are you currently taking this medication (dosage and times per day)? 1 time daily  3. Are you having a reaction (difficulty breathing--STAT)?no   4. What is your medication issue? Patient states that [facility] states that this medication has been discontinued. Patient states that he needs another medication as a replacement. Please advise.

## 2017-12-31 ENCOUNTER — Other Ambulatory Visit: Payer: Self-pay

## 2017-12-31 MED ORDER — IRBESARTAN 300 MG PO TABS: 300.0000 mg | ORAL_TABLET | Freq: Every day | ORAL | 0 refills | Status: DC

## 2017-12-31 NOTE — Telephone Encounter (Signed)
Called [facility] to verify status of medication.   Irbesartan on backorder.  Routed to pharmd to review and advise

## 2017-12-31 NOTE — Telephone Encounter (Signed)
Put him back on valsartan 320 mg daily

## 2017-12-31 NOTE — Telephone Encounter (Signed)
Left message for patient to call back  

## 2018-01-01 DIAGNOSIS — F418 Other specified anxiety disorders: Secondary | ICD-10-CM | POA: Diagnosis not present

## 2018-01-01 DIAGNOSIS — G4700 Insomnia, unspecified: Secondary | ICD-10-CM | POA: Diagnosis not present

## 2018-01-01 MED ORDER — VALSARTAN 320 MG PO TABS: 320.0000 mg | ORAL_TABLET | Freq: Every day | ORAL | 2 refills | Status: DC

## 2018-01-01 NOTE — Telephone Encounter (Signed)
Spoke to patient .  Aware prescription e-sent to pharmacy.  appt schedule for 04/21/18 -- it will be a 18 month follow up ( it was next available slot)  Patient verbalized understanding.

## 2018-01-09 DIAGNOSIS — F418 Other specified anxiety disorders: Secondary | ICD-10-CM | POA: Diagnosis not present

## 2018-01-09 DIAGNOSIS — Z79899 Other long term (current) drug therapy: Secondary | ICD-10-CM | POA: Diagnosis not present

## 2018-01-11 ENCOUNTER — Other Ambulatory Visit: Payer: Self-pay | Admitting: Cardiovascular Disease

## 2018-01-23 DIAGNOSIS — F418 Other specified anxiety disorders: Secondary | ICD-10-CM | POA: Diagnosis not present

## 2018-01-23 DIAGNOSIS — I10 Essential (primary) hypertension: Secondary | ICD-10-CM | POA: Diagnosis not present

## 2018-01-23 DIAGNOSIS — I4891 Unspecified atrial fibrillation: Secondary | ICD-10-CM | POA: Diagnosis not present

## 2018-02-24 DIAGNOSIS — R42 Dizziness and giddiness: Secondary | ICD-10-CM | POA: Diagnosis not present

## 2018-02-24 DIAGNOSIS — I4891 Unspecified atrial fibrillation: Secondary | ICD-10-CM | POA: Diagnosis not present

## 2018-02-24 DIAGNOSIS — F418 Other specified anxiety disorders: Secondary | ICD-10-CM | POA: Diagnosis not present

## 2018-02-24 DIAGNOSIS — I10 Essential (primary) hypertension: Secondary | ICD-10-CM | POA: Diagnosis not present

## 2018-02-26 ENCOUNTER — Other Ambulatory Visit: Payer: Self-pay | Admitting: Cardiovascular Disease

## 2018-03-11 DIAGNOSIS — Z8546 Personal history of malignant neoplasm of prostate: Secondary | ICD-10-CM | POA: Diagnosis not present

## 2018-03-17 DIAGNOSIS — Z8546 Personal history of malignant neoplasm of prostate: Secondary | ICD-10-CM | POA: Diagnosis not present

## 2018-03-17 DIAGNOSIS — R351 Nocturia: Secondary | ICD-10-CM | POA: Diagnosis not present

## 2018-03-22 ENCOUNTER — Other Ambulatory Visit: Payer: Self-pay | Admitting: Cardiovascular Disease

## 2018-03-23 ENCOUNTER — Telehealth: Payer: Self-pay | Admitting: Nurse Practitioner

## 2018-03-23 ENCOUNTER — Other Ambulatory Visit: Payer: Self-pay | Admitting: Cardiovascular Disease

## 2018-03-23 NOTE — Telephone Encounter (Signed)
   I received a call from the pts pharmacy requesting a refill on Mr. Hilbun' Eliquis as he is about to run out.  I reviewed his chart.  He has not been seen by Dr. Evette Georges in September 2018.  He had previously been followed in our anticoagulation clinic, but not seen there since July 2019.  It was at that time that he was switched to Eliquis.  I provided a 30-day prescription for Eliquis 5 mg twice daily (prior labs, weight/age reviewed) with 0 refills.  Advised that he will need office follow-up for future refills.  The pharmacist says that she will relay this message to him.  I will forward this message to our scheduling team to try and get him set up to be seen.  Murray Hodgkins, NP 03/23/2018, 3:48 PM

## 2018-03-24 NOTE — Telephone Encounter (Signed)
Rx request sent to pharmacy.  

## 2018-04-03 ENCOUNTER — Other Ambulatory Visit: Payer: Self-pay | Admitting: Cardiovascular Disease

## 2018-04-21 ENCOUNTER — Ambulatory Visit: Payer: Medicare Other | Admitting: Cardiovascular Disease

## 2018-04-21 ENCOUNTER — Encounter (INDEPENDENT_AMBULATORY_CARE_PROVIDER_SITE_OTHER): Payer: Self-pay

## 2018-04-21 ENCOUNTER — Encounter: Payer: Self-pay | Admitting: Cardiovascular Disease

## 2018-04-21 VITALS — BP 132/70 | HR 95 | Ht 75.0 in | Wt 190.6 lb

## 2018-04-21 DIAGNOSIS — Z86718 Personal history of other venous thrombosis and embolism: Secondary | ICD-10-CM | POA: Diagnosis not present

## 2018-04-21 DIAGNOSIS — I10 Essential (primary) hypertension: Secondary | ICD-10-CM | POA: Diagnosis not present

## 2018-04-21 DIAGNOSIS — R42 Dizziness and giddiness: Secondary | ICD-10-CM | POA: Diagnosis not present

## 2018-04-21 DIAGNOSIS — K219 Gastro-esophageal reflux disease without esophagitis: Secondary | ICD-10-CM

## 2018-04-21 DIAGNOSIS — Z7901 Long term (current) use of anticoagulants: Secondary | ICD-10-CM

## 2018-04-21 DIAGNOSIS — E7800 Pure hypercholesterolemia, unspecified: Secondary | ICD-10-CM

## 2018-04-21 MED ORDER — ATORVASTATIN CALCIUM 10 MG PO TABS: 10.0000 mg | ORAL_TABLET | Freq: Every day | ORAL | 3 refills | Status: DC

## 2018-04-21 MED ORDER — AMLODIPINE BESYLATE 10 MG PO TABS: 10.0000 mg | ORAL_TABLET | Freq: Every day | ORAL | 3 refills | Status: DC

## 2018-04-21 MED ORDER — METOPROLOL TARTRATE 50 MG PO TABS: ORAL_TABLET | ORAL | 2 refills | Status: DC

## 2018-04-21 MED ORDER — VALSARTAN 320 MG PO TABS: 160.0000 mg | ORAL_TABLET | Freq: Every day | ORAL | 0 refills | Status: DC

## 2018-04-21 MED ORDER — SPIRONOLACTONE 25 MG PO TABS: 12.5000 mg | ORAL_TABLET | ORAL | 0 refills | Status: DC

## 2018-04-21 MED ORDER — VALSARTAN 320 MG PO TABS: ORAL_TABLET | ORAL | 0 refills | Status: DC

## 2018-04-21 NOTE — Patient Instructions (Addendum)
Medication Instructions:  Decrease Valsartan to half 160 in the morning and 160 in the evening =320 mg Decrease Losartan to 25 mg in the morning (0.5 tablet), and 50 mg in the evening (1 tablets) Decrease Spirolactone to 12.5 mg in the morning. If you need a refill on your cardiac medications before your next appointment, please call your pharmacy.    Follow-Up: At [facility], you and your health needs are our priority.  As part of our continuing mission to provide you with exceptional heart care, we have created designated Provider Care Teams.  These Care Teams include your primary Cardiologist (physician) and Advanced Practice Providers (APPs -  Physician Assistants and Nurse Practitioners) who all work together to provide you with the care you need, when you need it. You will need a follow up appointment in 3 months.  You may see Dr.Kelly or one of the following Advanced Practice Providers on your designated Care Team: Almyra Deforest, [place] . Fabian Sharp, PA-C

## 2018-04-21 NOTE — Progress Notes (Signed)
Patient ID: Pedro Kent, male   DOB: December 27, 1940, 78 y.o.   MRN: [id number]    Primary MD: Dr. Tommy Medal  HPI: Pedro Kent is a 78 y.o. male who presents for a 17 month follow-up cardiology evaluation.  Pedro Kent developed a  deep vein thrombosis of his left lower extremity in January 2007and has been on chronic anticoagulation therapy with Coumadin ever since. Additional problems include hypertension with adrenal hyperplasia, history of prostate CA treated with history of radiation seeds,  hyperlipidemia, and an abnormal ECG with T wave inversion. An echo Doppler study in January 2011  showed mild concentric LVH, mild MR with possible torn redundant chordae tendinea, mild aortic valve sclerosis, and mild coronary insufficiency.   On 09/23/2012 an echo Doppler study showed an ejection fraction at 60-65%. He had mild left ventricular hypertrophy and had normal diastolic parameters on this present study. There is very minimal pulmonary pressure elevation at 31 mm. Is mitral valve is mildly thickened with no significant regurgitation.  When I last saw him he was on a multiple drug drug regimen for hypertension, consisting of amlodipine 10 mg, metoprolol 50 mg in the am and 25 mg in the pm, spironolactone 25 mg daily and valsartan 320 mg.   his blood pressure was elevated and I recommended that he increase spironolactone and take 25 mg in the morning and 12.5 mg at night.  Ifblood pressure continued to be elevated, then he will increase this to 25 mg twice a day.  Over the past several months, he states his blood pressure has improved.  He will be establishing new primary care with Dr. Maudie Mercury.  He has seen Dr. Karsten Ro for his prostate CA.  He continues to take Prilosec for GERD but this has been stable and he takes rarely.  He is taking atorvastatin 10 mg for hyperlipidemia.  He is on chronic warfarin therapy with his clot history.  He denies recent bleeding.  He continues to smoke several small cigars  per day.  He denies any chest pain, PND, orthopnea.   Since I last saw him in September 2018 he has had some issues with vertigo.  He  was evaluated in the emergency room was not felt to have a stroke.  He had an MRI that was negative for an acute stroke.  Was able to walk without difficulty.  Due to symptoms from his peripheral vertigo he was instructed on the Epley maneuver.  He was also reevaluated in the emergency room setting for depression.  He has seen Dr. Theda Sers in Dr. Julianne Rice absence and he has had some medication adjustment eluding slight reduction in his beta-blocker therapy.  He now has been taking amlodipine 10 mg, metoprolol 25 mg twice a day, and has been taking spironolactone 37-1/2 mg in the morning in addition to valsartan 320 mg daily.  He is no longer on warfarin but is now on Eliquis for anticoagulation.  He continues to be on atorvastatin for hyperlipidemia.  He has history of prostate CA which she is seen Dr. Karsten Ro.  Continues to be on Flomax.  GERD has been controlled with omeprazole.  He presents for reevaluation.  Past Medical History:  Diagnosis Date  . Adrenal hyperplasia ([place])   . Cancer [facility]) 2010   prostate cancer  . DVT (deep venous thrombosis) ([place])   . Hyperlipidemia   . Hypertension     Past Surgical History:  Procedure Laterality Date  . 2-D echocardiogram  03/10/2009   Normal  left ventricular systolic function. Mild MR. Normal RV systolic pressure. Mild pulmonic valvular regurgitation.  . Cardiac stress test  04/05/2010   Negative for ischemia    Allergies  Allergen Reactions  . Ace Inhibitors Other (See Comments)    Unknown    Current Outpatient Medications  Medication Sig Dispense Refill  . amLODipine (NORVASC) 10 MG tablet Take 1 tablet (10 mg total) by mouth daily. KEEP OV. 90 tablet 3  . aspirin EC 81 MG tablet Take 81 mg by mouth daily.    Marland Kitchen atorvastatin (LIPITOR) 10 MG tablet Take 1 tablet (10 mg total) by mouth daily. PT OVERDUE FOR OV  PLEASE CALL FOR APPT 90 tablet 3  . diazepam (VALIUM) 5 MG tablet Take 1 tablet (5 mg total) by mouth 2 (two) times daily. 10 tablet 0  . ELIQUIS 5 MG TABS tablet TAKE 1 TABLET (5 MG TOTAL) BY MOUTH 2 (TWO) TIMES DAILY. 60 tablet 5  . Green Tea, Camillia sinensis, (GREEN TEA PO) Take by mouth. Cup of Tea daily.    . meclizine (ANTIVERT) 25 MG tablet Take 1 tablet (25 mg total) by mouth 3 (three) times daily as needed for dizziness. 30 tablet 0  . metoprolol tartrate (LOPRESSOR) 50 MG tablet Take 25 mg (0.5 tablet) in the morning, and 50 mg (1 tablet) in the evening 45 tablet 2  . mirtazapine (REMERON) 7.5 MG tablet Take 1 tablet (7.5 mg total) by mouth at bedtime. 14 tablet 0  . Misc Natural Products (MENS PROSTATE HEALTH FORMULA PO) Take 4 capsules by mouth daily.    . Multiple Vitamins-Minerals (MULTIVITAMIN) tablet Take 1 tablet by mouth daily.    Marland Kitchen omeprazole (PRILOSEC) 20 MG capsule Take 20 mg by mouth daily as needed (heartburn).     Marland Kitchen spironolactone (ALDACTONE) 25 MG tablet Take 0.5 tablets (12.5 mg total) by mouth every morning. 90 tablet 0  . tamsulosin (FLOMAX) 0.4 MG CAPS capsule TAKE 1 CAPSULE TWICE A DAY (Patient taking differently: Take 0.87m oral daily) 180 capsule 1  . valsartan (DIOVAN) 320 MG tablet Take half (160 mg) in the morning and the other half (160 mg) in the evening = 320 mg 90 tablet 0   No current facility-administered medications for this visit.     Social History   Socioeconomic History  . Marital status: Divorced    Spouse name: Not on file  . Number of children: Not on file  . Years of education: Not on file  . Highest education level: Not on file  Occupational History  . Not on file  Social Needs  . Financial resource strain: Not on file  . Food insecurity:    Worry: Not on file    Inability: Not on file  . Transportation needs:    Medical: Not on file    Non-medical: Not on file  Tobacco Use  . Smoking status: Current Every Day Smoker     Packs/day: 0.25    Types: Cigars  . Smokeless tobacco: Never Used  Substance and Sexual Activity  . Alcohol use: Yes    Comment: rarely  . Drug use: Not on file  . Sexual activity: Not on file  Lifestyle  . Physical activity:    Days per week: Not on file    Minutes per session: Not on file  . Stress: Not on file  Relationships  . Social connections:    Talks on phone: Not on file    Gets together: Not on file  Attends religious service: Not on file    Active member of club or organization: Not on file    Attends meetings of clubs or organizations: Not on file    Relationship status: Not on file  . Intimate partner violence:    Fear of current or ex partner: Not on file    Emotionally abused: Not on file    Physically abused: Not on file    Forced sexual activity: Not on file  Other Topics Concern  . Not on file  Social History Narrative  . Not on file   Socially he has 2 children one grandchild. He does walk. He denies recent alcohol use.  History reviewed. No pertinent family history.   ROS General: Negative; No fevers, chills, or night sweats;  HEENT: Negative; No changes in vision or hearing, sinus congestion, difficulty swallowing Pulmonary: Negative; No cough, wheezing, shortness of breath, hemoptysis Cardiovascular: See history of present illness GI: positive for GERD on omeprazole No nausea, vomiting, diarrhea, or abdominal pain GU: History of prostate CA Musculoskeletal: Negative; no myalgias, joint pain, or weakness Hematologic/Oncology: Negative; no easy bruising, bleeding Endocrine: Negative; no heat/cold intolerance; no diabetes Neuro:vertigo Skin: Negative; No rashes or skin lesions Psychiatric: Pression Sleep: Negative; No snoring, daytime sleepiness, hypersomnolence, bruxism, restless legs, hypnogognic hallucinations, no cataplexy Other comprehensive 14 point system review is negative.    PE BP 132/70   Pulse 95   Ht '6\' 3"'  (1.905 m)   Wt 190  lb 9.6 oz (86.5 kg)   BMI 23.82 kg/m    Repeat blood pressure by me was 120/74 in the supine position and was 112/72 in the standing position.  Wt Readings from Last 3 Encounters:  04/21/18 190 lb 9.6 oz (86.5 kg)  09/03/17 205 lb (93 kg)  11/16/16 202 lb 9.6 oz (91.9 kg)   General: Alert, oriented, no distress.  Skin: normal turgor, no rashes, warm and dry HEENT: Normocephalic, atraumatic. Pupils equal round and reactive to light; sclera anicteric; extraocular muscles intact;  Nose without nasal septal hypertrophy Mouth/Parynx benign; Mallinpatti scale 3 Neck: No JVD, no carotid bruits; normal carotid upstroke Lungs: clear to ausculatation and percussion; no wheezing or rales Chest wall: without tenderness to palpitation Heart: PMI not displaced, RRR, s1 s2 normal, 1/6 systolic murmur, no diastolic murmur, no rubs, gallops, thrills, or heaves Abdomen: soft, nontender; no hepatosplenomehaly, BS+; abdominal aorta nontender and not dilated by palpation. Back: no CVA tenderness Pulses 2+ Musculoskeletal: full range of motion, normal strength, no joint deformities Extremities: no clubbing cyanosis or edema, Homan's sign negative  Neurologic: grossly nonfocal; Cranial nerves grossly wnl Psychologic: Normal mood and affect   ECG (independently read by me): Sinus rhythm at 95 bpm with occasional PAC.  Poor anterior R wave progression.  The lateral T wave abnormality.  September 2018 ECG (independently read by me): Sinus tachycardia 59 bpm.  Poor progression V1 through V3.  Normal intervals.  No ectopy.  March 2018 ECG (independently read by me): Normal sinus rhythm at 61 bpm with mild sinus arrhythmia.  Poor R wave progression.  Previously noted inferolateral T changes.  May 2017 ECG (independently read by me): Normal sinus rhythm with occasional PACs.  Previously noted inferolateral T wave abnormality.  QTc interval 410 ms.  December 2016 ECG (independently read by me):  Normal sinus  rhythm with an occasional PAC. Previously noted inferolateral T wave inversion  November 2015 ECG (independently read by me): Normal sinus rhythm at 62 bpm.  Poor R-wave progression  previously noted T-wave inversion inferolaterally  Prior September 2014 ECG: Sinus rhythm at 57 beats a minute. Previously noted T-wave abnormalities inferolaterally.  LABS: BMP Latest Ref Rng & Units 10/08/2017 09/03/2017 09/01/2017  Glucose 70 - 99 mg/dL 112(H) 156(H) 104(H)  BUN 8 - 23 mg/dL '18 17 16  ' Creatinine 0.61 - 1.24 mg/dL 1.25(H) 1.43(H) 1.34(H)  Sodium 135 - 145 mmol/L 135 142 136  Potassium 3.5 - 5.1 mmol/L 3.8 4.5 4.2  Chloride 98 - 111 mmol/L 100 108 105  CO2 22 - 32 mmol/L '25 27 24  ' Calcium 8.9 - 10.3 mg/dL 9.3 9.5 9.4   Hepatic Function Latest Ref Rng & Units 10/08/2017 05/16/2016 01/20/2015  Total Protein 6.5 - 8.1 g/dL 7.1 6.8 6.8  Albumin 3.5 - 5.0 g/dL 4.3 4.0 4.0  AST 15 - 41 U/L '22 23 31  ' ALT 0 - 44 U/L '27 28 30  ' Alk Phosphatase 38 - 126 U/L 59 65 58  Total Bilirubin 0.3 - 1.2 mg/dL 1.6(H) 0.6 0.9   CBC Latest Ref Rng & Units 10/08/2017 09/03/2017 09/01/2017  WBC 4.0 - 10.5 K/uL 4.5 5.1 6.0  Hemoglobin 13.0 - 17.0 g/dL 14.2 13.9 14.4  Hematocrit 39.0 - 52.0 % 39.2 41.1 42.3  Platelets 150 - 400 K/uL 172 169 164   Lab Results  Component Value Date   MCV 95.4 10/08/2017   MCV 101.0 (H) 09/03/2017   MCV 99.8 09/01/2017   Lab Results  Component Value Date   TSH 0.91 05/16/2016  No results found for: HGBA1C   Lipid Panel     Component Value Date/Time   CHOL 135 05/16/2016 1127   TRIG 72 05/16/2016 1127   HDL 47 05/16/2016 1127   CHOLHDL 2.9 05/16/2016 1127   VLDL 14 05/16/2016 1127   LDLCALC 74 05/16/2016 1127   IMPRESSION:  1. Essential hypertension   2. Long term current use of anticoagulant therapy   3. History of DVT (deep vein thrombosis)   4. Vertigo   5. Pure hypercholesterolemia   6. Gastroesophageal reflux disease without esophagitis     ASSESSMENT AND  PLAN: Pedro Kent is a 78 year old African-American gentleman who is 13 years since developing his deep vein thrombosis in January 2007. He had been on chronic Coumadin therapy which since I had last seen him what has been switched to Eliquis which he currently takes 5 mg twice a day.  Past he has had significant issues with blood pressure elevation and required a 4 drug regimen.  His medications have been adjusted since his last evaluation with me in September 2018.  He also has admits to episodes of dizziness intermittently.  He had an 8 to less than 10 mm drop in blood pressure going from sitting to standing but I was unable to assess supine blood pressure since he did not want to get on the stretcher.  Presently, I am slightly changing his medical regimen and he will continue amlodipine 10 mg daily.  His resting pulse is now in the 90s with frequent premature atrial beats and I will readjust his metoprolol such that he will take 25 mg in the morning but will increase his evening dose to 50 mg.  I have also suggested that he change his valsartan from 320 mg in the morning and in its place take 160 mg twice a day.  I will reduce his spironolactone to just 12.5 mg since there are no signs of edema presently.  He will continue with his omeprazole for his  GERD.  He continues to be on atorvastatin 10 mg for hyperlipidemia.  In July 2019 LDL cholesterol was 76.  With his Eliquis anticoagulation and absence of CAD we may be able to discontinue aspirin.  I will see him in 3 months for follow-up evaluation.  Time spent: 25 minutes  Troy Sine, MD, [facility]  04/21/2018 1:02 PM

## 2018-04-22 ENCOUNTER — Other Ambulatory Visit: Payer: Self-pay

## 2018-04-22 ENCOUNTER — Telehealth: Payer: Self-pay | Admitting: Cardiovascular Disease

## 2018-04-22 MED ORDER — APIXABAN 5 MG PO TABS: ORAL_TABLET | ORAL | 5 refills | Status: DC

## 2018-04-22 NOTE — Telephone Encounter (Signed)
New Message    *STAT* If patient is at the pharmacy, call can be transferred to refill team.   1. Which medications need to be refilled? (please list name of each medication and dose if known) Eliquis 5mg   2. Which pharmacy/location (including street and city if local pharmacy) is medication to be sent to? [facility] on [address]  3. Do they need a 30 day or 90 day supply? 90 day supply

## 2018-04-23 ENCOUNTER — Other Ambulatory Visit: Payer: Self-pay | Admitting: Cardiovascular Disease

## 2018-05-05 ENCOUNTER — Other Ambulatory Visit: Payer: Self-pay

## 2018-05-05 DIAGNOSIS — F418 Other specified anxiety disorders: Secondary | ICD-10-CM | POA: Diagnosis not present

## 2018-05-05 DIAGNOSIS — I10 Essential (primary) hypertension: Secondary | ICD-10-CM | POA: Diagnosis not present

## 2018-05-05 DIAGNOSIS — Z86718 Personal history of other venous thrombosis and embolism: Secondary | ICD-10-CM | POA: Diagnosis not present

## 2018-05-05 DIAGNOSIS — E7800 Pure hypercholesterolemia, unspecified: Secondary | ICD-10-CM | POA: Diagnosis not present

## 2018-05-05 MED ORDER — TAMSULOSIN HCL 0.4 MG PO CAPS: 0.4000 mg | ORAL_CAPSULE | Freq: Two times a day (BID) | ORAL | 0 refills | Status: DC

## 2018-07-15 ENCOUNTER — Other Ambulatory Visit: Payer: Self-pay | Admitting: Cardiovascular Disease

## 2018-08-06 ENCOUNTER — Telehealth: Payer: Self-pay | Admitting: Physician Assistant

## 2018-08-06 NOTE — Telephone Encounter (Signed)
LVM for pre reg, no email, sent message to my chart

## 2018-08-07 ENCOUNTER — Telehealth: Payer: Medicare Other | Admitting: Cardiovascular Disease

## 2018-08-07 NOTE — Telephone Encounter (Signed)
Called to pre reg, phone is disconnected

## 2018-08-08 NOTE — Telephone Encounter (Signed)
prereg-

## 2018-08-08 NOTE — Telephone Encounter (Signed)
Follow up    Patient son is returning call. Contact number is [phone]

## 2018-08-10 NOTE — Progress Notes (Signed)
Virtual Visit via Video Note   This visit type was conducted due to national recommendations for restrictions regarding the COVID-19 Pandemic (e.g. social distancing) in an effort to limit this patient's exposure and mitigate transmission in our community.  Due to his co-morbid illnesses, this patient is at least at moderate risk for complications without adequate follow up.  This format is felt to be most appropriate for this patient at this time.  All issues noted in this document were discussed and addressed.  A limited physical exam was performed with this format.  Please refer to the patient's chart for his consent to telehealth for [facility].  Evaluation Performed:  Follow-up visit  This visit type was conducted due to national recommendations for restrictions regarding the COVID-19 Pandemic (e.g. social distancing).  This format is felt to be most appropriate for this patient at this time.  All issues noted in this document were discussed and addressed.  No physical exam was performed (except for noted visual exam findings with Video Visits).  Please refer to the patient's chart (MyChart message for video visits and phone note for telephone visits) for the patient's consent to telehealth for [facility] Date:  08/11/2018   ID:  Pedro Kent, DOB Dec 03, 1940, MRN [id number]  Patient Location:  [place] [address]   Provider location:   [facility] [address] [place] [address], [place] [address]   PCP:  Janie Morning, DO  Cardiologist:  Shelva Majestic, MD  Electrophysiologist:  None   Chief Complaint: Hypertension follow-up  History of Present Illness:    Pedro Kent is a 78 y.o. male who presents via audio/video conferencing for a telehealth visit today.  Patient verified DOB and address.  PMH of essential hypertension, hyperlipidemia, prostate cancer, tobacco use, anxiety, DVT (January 2007) taking Eliquis 5 mg twice daily, vertigo,  hypercholesterolemia, GERD.  He was last seen by Dr. Claiborne Billings on 04/21/2018.  During that time his amlodipine to 10 mg daily was continued, metoprolol tartrte was increased to 75 mg (25 mg in the am and 50 mg HS), valsartan was changed to 160 mg twice daily and his spironolactone was reduced to 12.5 mg in the absence of edema.  Aspirin was also discontinued.  Today he follows up for hypertension management and states he feels well. He states  has noticed some slight morning fatigue.  He states that the fatigue goes away as he exercises in the morning.  He has been walking on his home treadmill and swimming. He reports that his blood pressures have been in the 130s over 70s. He states that he has been checking his BP about once a month however, he was unable to provide [place] with a blood pressure today.  He states his blood pressure cuff was not working this morning.  He denies chest pain, shortness of breath, syncope, weakness, headaches, melena, hematuria, hemoptysis, lower extremity edema, orthopnea and PND.   The patient does not symptoms concerning for COVID-19 infection (fever, chills, cough, or new SHORTNESS OF BREATH).  He states that he wears a mask when he goes out in public and continues to social distance.   Prior CV studies:   The following studies were reviewed today:  Echocardiogram: (09/23/2012)  LVEF 60 to 65%, LVH moderate concentric hypertrophy Mitral valve: Calcified annulus.  Mildly thickened leaflets Atrial septum: No fit defect or patent foramen ovale Pulmonary arteries: PA peak pressure: 33 mmHg  EKG: 04/21/2018 Sinus rhythm with PACs 95 bpm  Past Medical History:  Diagnosis Date  . Adrenal hyperplasia ([place])   . Cancer [facility]) 2010   prostate cancer  . DVT (deep venous thrombosis) ([place])   . Hyperlipidemia   . Hypertension    Past Surgical History:  Procedure Laterality Date  . 2-D echocardiogram  03/10/2009   Normal left ventricular systolic function. Mild MR. Normal RV  systolic pressure. Mild pulmonic valvular regurgitation.  . Cardiac stress test  04/05/2010   Negative for ischemia     Current Meds  Medication Sig  . amLODipine (NORVASC) 10 MG tablet Take 1 tablet (10 mg total) by mouth daily. KEEP OV.  Marland Kitchen apixaban (ELIQUIS) 5 MG TABS tablet TAKE 1 TABLET (5 MG TOTAL) BY MOUTH 2 (TWO) TIMES DAILY.  Marland Kitchen aspirin EC 81 MG tablet Take 81 mg by mouth daily.  Marland Kitchen atorvastatin (LIPITOR) 10 MG tablet Take 1 tablet (10 mg total) by mouth daily. PT OVERDUE FOR OV PLEASE CALL FOR APPT  . diazepam (VALIUM) 5 MG tablet Take 1 tablet (5 mg total) by mouth 2 (two) times daily.  Nyoka Cowden Tea, Camillia sinensis, (GREEN TEA PO) Take by mouth. Cup of Tea daily.  . meclizine (ANTIVERT) 25 MG tablet Take 1 tablet (25 mg total) by mouth 3 (three) times daily as needed for dizziness.  . metoprolol tartrate (LOPRESSOR) 50 MG tablet Take 25 mg (0.5 tablet) in the morning, and 50 mg (1 tablet) in the evening  . mirtazapine (REMERON) 7.5 MG tablet Take 1 tablet (7.5 mg total) by mouth at bedtime.  . Misc Natural Products (MENS PROSTATE HEALTH FORMULA PO) Take 4 capsules by mouth daily.  . Multiple Vitamins-Minerals (MULTIVITAMIN) tablet Take 1 tablet by mouth daily.  Marland Kitchen omeprazole (PRILOSEC) 20 MG capsule Take 20 mg by mouth daily as needed (heartburn).   Marland Kitchen spironolactone (ALDACTONE) 25 MG tablet Take 12.5 mg (half tablet) by mouth each morning.  . tamsulosin (FLOMAX) 0.4 MG CAPS capsule Take 1 capsule (0.4 mg total) by mouth 2 (two) times daily.  . valsartan (DIOVAN) 320 MG tablet TAKE HALF (160 MG) IN THE MORNING AND THE OTHER HALF (160 MG) IN THE EVENING = 320 MG     Allergies:   Ace inhibitors   Social History   Tobacco Use  . Smoking status: Current Every Day Smoker    Packs/day: 0.25    Types: Cigars  . Smokeless tobacco: Never Used  Substance Use Topics  . Alcohol use: Yes    Comment: rarely  . Drug use: Not on file     Family Hx: The patient's family history is not  on file.  ROS:   Please see the history of present illness.     All other systems reviewed and are negative.   Labs/Other Tests and Data Reviewed:    Recent Labs: 10/08/2017: ALT 27; BUN 18; Creatinine, Ser 1.25; Hemoglobin 14.2; Platelets 172; Potassium 3.8; Sodium 135   Recent Lipid Panel Lab Results  Component Value Date/Time   CHOL 135 05/16/2016 11:27 AM   TRIG 72 05/16/2016 11:27 AM   HDL 47 05/16/2016 11:27 AM   CHOLHDL 2.9 05/16/2016 11:27 AM   LDLCALC 74 05/16/2016 11:27 AM    Wt Readings from Last 3 Encounters:  08/11/18 190 lb (86.2 kg)  04/21/18 190 lb 9.6 oz (86.5 kg)  09/03/17 205 lb (93 kg)     Exam:    Vital Signs: Patient not able to obtain his vital signs today at home.  States his blood pressure  cuff was not working this morning.  He reports that his blood pressures have been in the 130s over 70s.  He is in no acute distress, alert and oriented x3, able to ask appropriate questions based off of his health problems.  ASSESSMENT & PLAN:    1.  Essential hypertension-well controlled 130s over 70s Continue amlodipine 10 mg daily Continue metoprolol 75 mg- (25  AM and 50 pm) Continue valsartan 160 in the morning 160 in the evening Continue Spironolactone 12.5 mg daily Continue daily exercise Patient instructed to obtain blood pressures more frequently throughout the week and keep a log for review. Patient encouraged to increase caloric intake of heart healthy foods.  He states he is eating 2 meals a day and maintaining his weight. Obtain BMP in the next 1 to 2 weeks and do a blood pressure check at that time. He should also bring his blood pressure cuff to compare with ours in the office.  2.  History of DVT- no leg swelling/pain Continue Eliquis 5 mg  3.  Pure hypercholesterolemia- LDL 74, 04/2016 Continue atorvastatin 10 mg tablet daily  4.  GERD- no reflux complaints Continue omeprazole 20 mg tablet daily as needed  COVID-19 Education: The  signs and symptoms of COVID-19 were discussed with the patient and how to seek care for testing (follow up with PCP or arrange E-visit).  The importance of social distancing was discussed today.  Patient Risk:   After full review of this patients clinical status, I feel that they are at least moderate risk at this time.  Time:   Today, I have spent 15 minutes with the patient with telehealth technology discussing blood pressure medication, diet exercise, BMI, COVID-19.     Medication Adjustments/Labs and Tests Ordered: Current medicines are reviewed at length with the patient today.  Concerns regarding medicines are outlined above.   Tests Ordered: Orders Placed This Encounter  Procedures  . Basic metabolic panel   Medication Changes: No orders of the defined types were placed in this encounter.   Disposition:  Disposition: Follow-up with Dr. Claiborne Billings in 6 months  Signed, Deberah Pelton, NP  08/11/2018 12:05 PM    [facility]

## 2018-08-11 ENCOUNTER — Telehealth (INDEPENDENT_AMBULATORY_CARE_PROVIDER_SITE_OTHER): Payer: Medicare Other | Admitting: Physician Assistant

## 2018-08-11 VITALS — Ht 72.0 in | Wt 190.0 lb

## 2018-08-11 DIAGNOSIS — I10 Essential (primary) hypertension: Secondary | ICD-10-CM | POA: Diagnosis not present

## 2018-08-11 DIAGNOSIS — Z86718 Personal history of other venous thrombosis and embolism: Secondary | ICD-10-CM

## 2018-08-11 DIAGNOSIS — K219 Gastro-esophageal reflux disease without esophagitis: Secondary | ICD-10-CM

## 2018-08-11 DIAGNOSIS — E7800 Pure hypercholesterolemia, unspecified: Secondary | ICD-10-CM

## 2018-08-11 NOTE — Patient Instructions (Signed)
Medication Instructions:  Your physician recommends that you continue on your current medications as directed. Please refer to the Current Medication list given to you today.  If you need a refill on your cardiac medications before your next appointment, please call your pharmacy.   Lab work: Your physician recommends that you return for lab work at your earliest convenience: BMET--to check your kidney function due to recent medication changes.  If you have labs (blood work) drawn today and your tests are completely normal, you will receive your results only by: Marland Kitchen MyChart Message (if you have MyChart) OR . A paper copy in the mail If you have any lab test that is abnormal or we need to change your treatment, we will call you to review the results.  Follow-Up: At [facility], you and your health needs are our priority.  As part of our continuing mission to provide you with exceptional heart care, we have created designated Provider Care Teams.  These Care Teams include your primary Cardiologist (physician) and Advanced Practice Providers (APPs -  Physician Assistants and Nurse Practitioners) who all work together to provide you with the care you need, when you need it. You will need a follow up appointment in 6 months.  Please call our office 2 months in advance to schedule this appointment.  You may see Shelva Majestic, MD or one of the following Advanced Practice Providers on your designated Care Team: [place], [place] . Fabian Sharp, PA-C  Any Other Special Instructions Will Be Listed Below (If Applicable). Your physician has requested that you monitor and record your blood pressure readings at home once weekly. Please use the same machine at the same time of day to check your readings and record them to bring to your follow-up visit.

## 2018-08-27 DIAGNOSIS — E7800 Pure hypercholesterolemia, unspecified: Secondary | ICD-10-CM | POA: Diagnosis not present

## 2018-08-27 DIAGNOSIS — I10 Essential (primary) hypertension: Secondary | ICD-10-CM | POA: Diagnosis not present

## 2018-08-28 LAB — BASIC METABOLIC PANEL
BUN/Creatinine Ratio: 15 (ref 10–24)
BUN: 18 mg/dL (ref 8–27)
CO2: 22 mmol/L (ref 20–29)
Calcium: 9.6 mg/dL (ref 8.6–10.2)
Chloride: 101 mmol/L (ref 96–106)
Creatinine, Ser: 1.2 mg/dL (ref 0.76–1.27)
GFR calc Af Amer: 66 mL/min/{1.73_m2} (ref >59)
GFR calc non Af Amer: 57 mL/min/{1.73_m2} — ABNORMAL LOW (ref >59)
Glucose: 85 mg/dL (ref 65–99)
Potassium: 4.8 mmol/L (ref 3.5–5.2)
Sodium: 138 mmol/L (ref 134–144)

## 2018-08-30 ENCOUNTER — Other Ambulatory Visit: Payer: Self-pay | Admitting: Cardiovascular Disease

## 2018-08-31 ENCOUNTER — Other Ambulatory Visit: Payer: Self-pay | Admitting: Cardiovascular Disease

## 2018-11-05 DIAGNOSIS — Z0000 Encounter for general adult medical examination without abnormal findings: Secondary | ICD-10-CM | POA: Diagnosis not present

## 2018-11-05 DIAGNOSIS — F418 Other specified anxiety disorders: Secondary | ICD-10-CM | POA: Diagnosis not present

## 2018-11-05 DIAGNOSIS — G4700 Insomnia, unspecified: Secondary | ICD-10-CM | POA: Diagnosis not present

## 2018-11-05 DIAGNOSIS — I10 Essential (primary) hypertension: Secondary | ICD-10-CM | POA: Diagnosis not present

## 2018-12-08 ENCOUNTER — Other Ambulatory Visit: Payer: Self-pay | Admitting: Cardiovascular Disease

## 2019-01-23 ENCOUNTER — Other Ambulatory Visit: Payer: Self-pay | Admitting: Cardiovascular Disease

## 2019-02-21 ENCOUNTER — Other Ambulatory Visit: Payer: Self-pay | Admitting: Cardiovascular Disease

## 2019-03-17 DIAGNOSIS — N401 Enlarged prostate with lower urinary tract symptoms: Secondary | ICD-10-CM | POA: Diagnosis not present

## 2019-03-17 DIAGNOSIS — Z8546 Personal history of malignant neoplasm of prostate: Secondary | ICD-10-CM | POA: Diagnosis not present

## 2019-03-17 DIAGNOSIS — R351 Nocturia: Secondary | ICD-10-CM | POA: Diagnosis not present

## 2019-04-17 ENCOUNTER — Other Ambulatory Visit: Payer: Self-pay | Admitting: Cardiovascular Disease

## 2019-04-18 ENCOUNTER — Other Ambulatory Visit: Payer: Self-pay | Admitting: Cardiovascular Disease

## 2019-05-03 ENCOUNTER — Other Ambulatory Visit: Payer: Self-pay | Admitting: Cardiovascular Disease

## 2019-05-14 ENCOUNTER — Other Ambulatory Visit: Payer: Self-pay | Admitting: Cardiovascular Disease

## 2019-07-17 IMAGING — MR MR HEAD W/O CM
10 of 11 series · 43 of 48 positions shown · non-contrast
Comparison: None.

CLINICAL DATA: Ataxia.  Acute onset.

EXAM:
MRI HEAD WITHOUT CONTRAST
TECHNIQUE: Multiplanar, multiecho pulse sequences of the brain and surrounding
structures were obtained without intravenous contrast.

[Series 5: ax dwi_tracew · axial · 3.0mm · 1.50mm/px · z∈[-50,+95]mm · 9 of 86 slices shown]
[im 1/86]
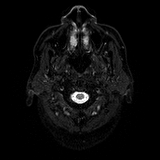
[im 11/86]
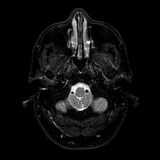
[im 22/86]
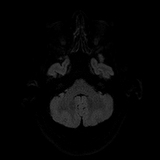
[im 32/86]
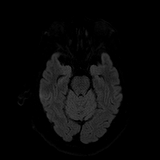
[im 43/86]
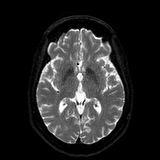
[im 54/86]
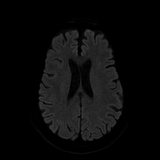
[im 64/86]
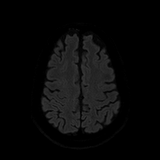
[im 75/86]
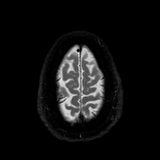
[im 86/86]
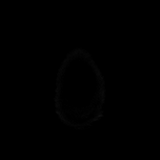

[Series 6: ax dwi_adc · axial · 3.0mm · 1.50mm/px · z∈[-50,+95]mm · 5 of 43 slices shown]
[im 1/43]
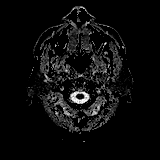
[im 11/43]
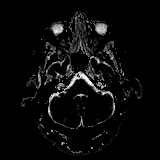
[im 22/43]
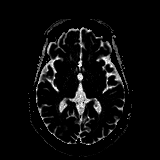
[im 32/43]
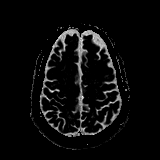
[im 43/43]
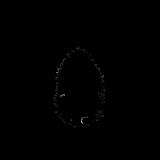

[Series 7: cor dwi_tracew · coronal · 5.0mm · 1.44mm/px · 7 of 76 slices shown]
[im 1/76]
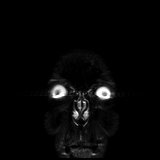
[im 13/76]
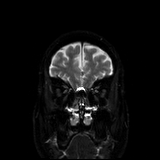
[im 26/76]
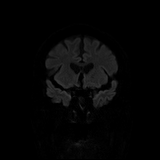
[im 38/76]
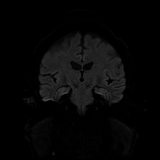
[im 51/76]
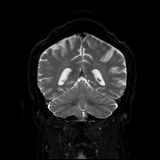
[im 63/76]
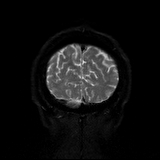
[im 76/76]
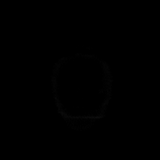

[Series 8: cor dwi_adc · coronal · 5.0mm · 1.44mm/px · 4 of 38 slices shown]
[im 1/38]
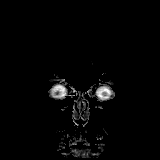
[im 13/38]
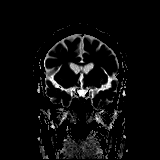
[im 25/38]
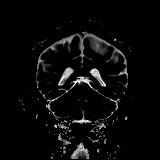
[im 38/38]
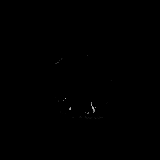

[Series 9: T1 · sagittal · 5.0mm · 0.78mm/px · 2 of 24 slices shown]
[im 1/24]
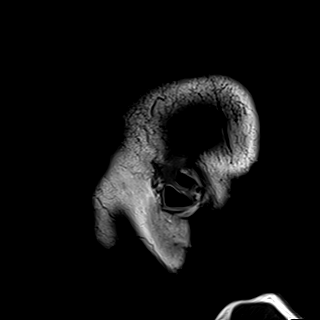
[im 24/24]
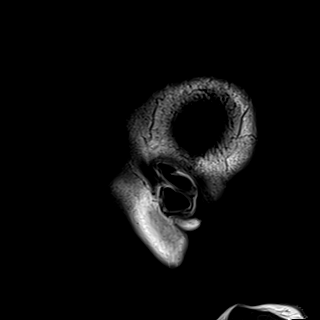

[Series 10: T2 · axial · 5.0mm · 0.72mm/px · z∈[-50,+94]mm · 2 of 25 slices shown (1 of 2)]
[im 1/25]
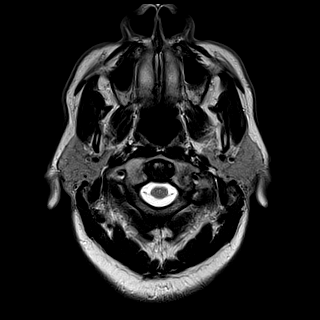
[im 25/25]
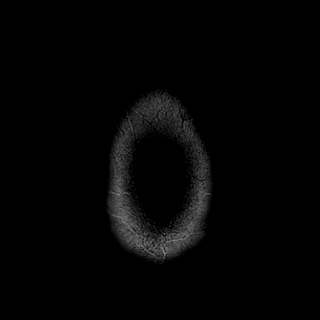

[Series 11: FLAIR · axial · 5.0mm · 0.45mm/px · z∈[-50,+94]mm · 2 of 25 slices shown]
[im 1/25]
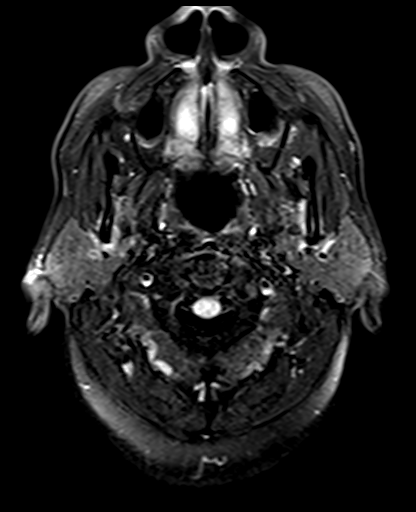
[im 25/25]
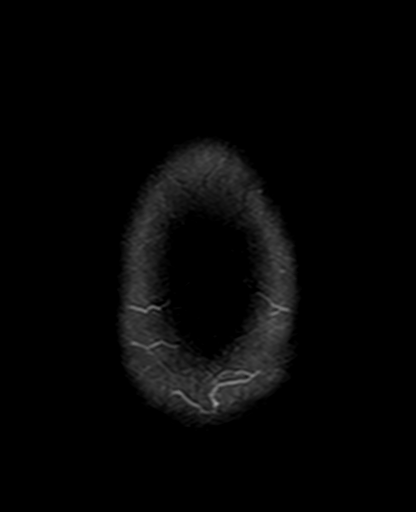

[Series 12: swi_images · axial · 3.0mm · 0.94mm/px · z∈[-51,+102]mm · 5 of 52 slices shown]
[im 1/52]
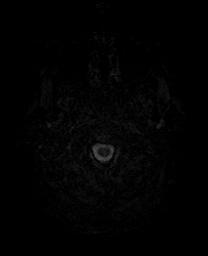
[im 13/52]
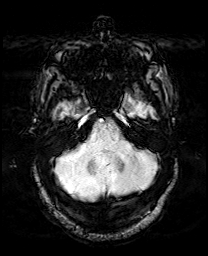
[im 26/52]
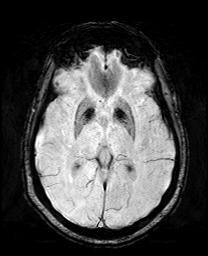
[im 39/52]
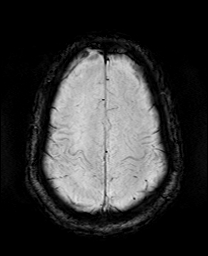
[im 52/52]
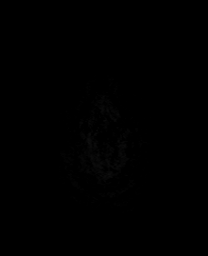

[Series 13: mip_images(sw) · axial · 24.0mm · 0.94mm/px · z∈[-40,+92]mm · 4 of 45 slices shown]
[im 1/45]
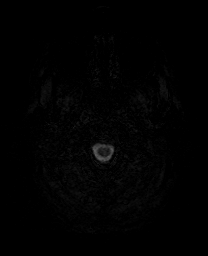
[im 15/45]
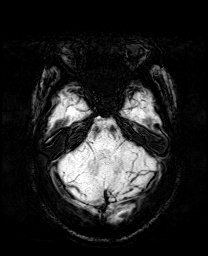
[im 30/45]
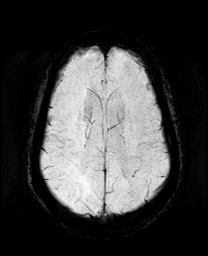
[im 45/45]
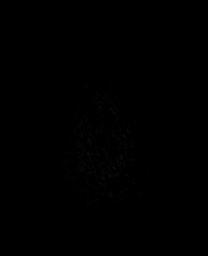

[Series 15: T2 · coronal · 5.0mm · 0.34mm/px · 3 of 26 slices shown (2 of 2)]
[im 1/26]
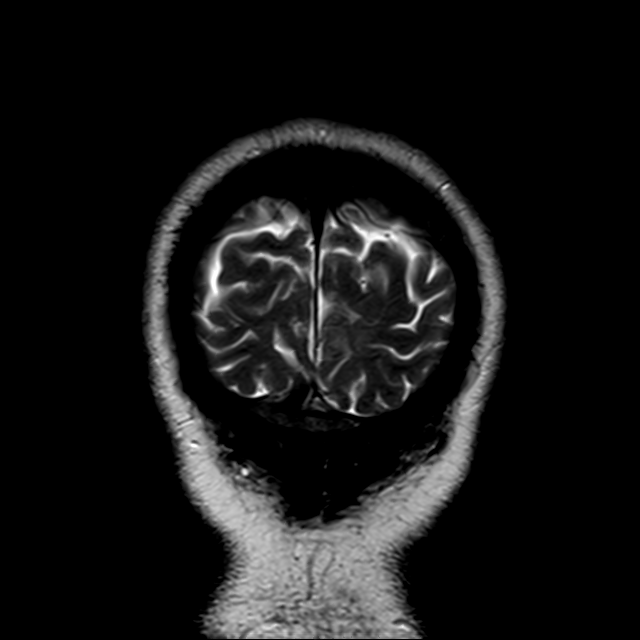
[im 13/26]
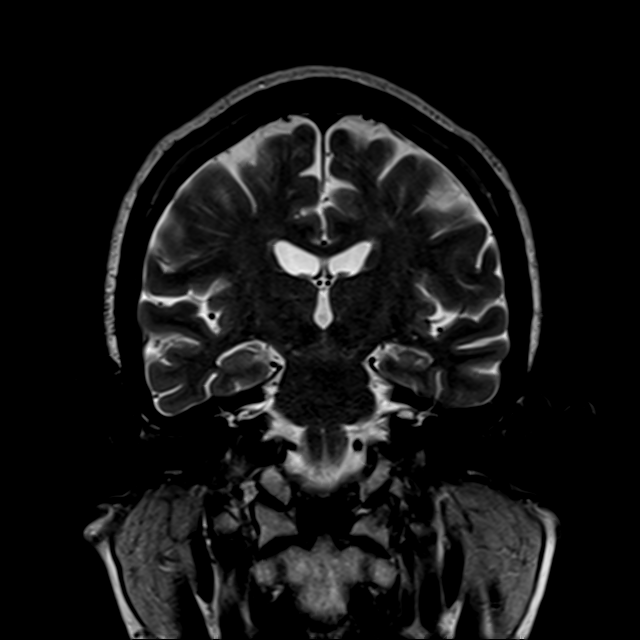
[im 26/26]
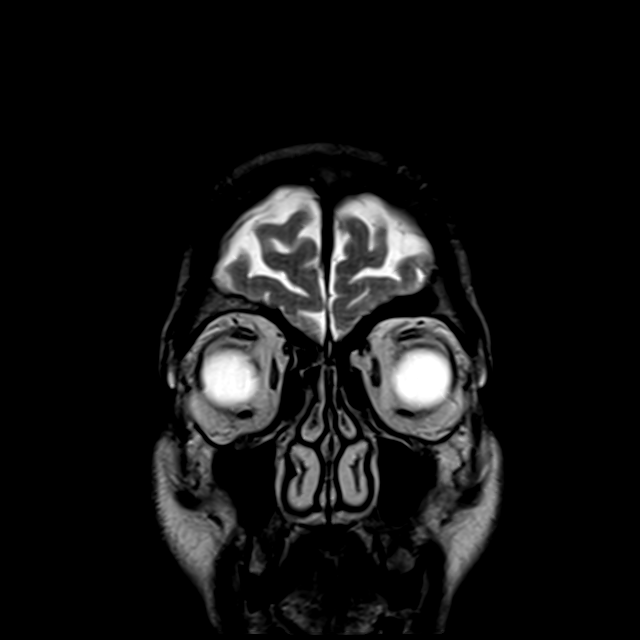

[43 of 48 positions shown; findings below may reference images not displayed]

FINDINGS: Brain: Diffusion imaging does not show any acute or subacute
infarction. The brainstem and cerebellum are normal. Cerebral
hemispheres show minimal small vessel change of the white matter,
less than usually seen at this age. No cortical or large vessel
territory infarction. No mass lesion, hemorrhage, hydrocephalus or
extra-axial collection.

Vascular: Major vessels at the base of the brain show flow.

Skull and upper cervical spine: Normal

Sinuses/Orbits: Clear/normal

Other: None
IMPRESSION: Normal study for age. No acute infarction. Minimal small vessel
change of the hemispheric white matter, less than often seen at this
age.

## 2019-08-05 ENCOUNTER — Other Ambulatory Visit: Payer: Self-pay | Admitting: Cardiovascular Disease

## 2019-08-19 ENCOUNTER — Other Ambulatory Visit: Payer: Self-pay | Admitting: Cardiovascular Disease

## 2019-09-11 ENCOUNTER — Other Ambulatory Visit: Payer: Self-pay | Admitting: Cardiovascular Disease

## 2019-09-11 NOTE — Telephone Encounter (Signed)
°*  STAT* If patient is at the pharmacy, call can be transferred to refill team.   1. Which medications need to be refilled? (please list name of each medication and dose if known)? metoprolol tartrate (LOPRESSOR) 50 MG tablet  2. Which pharmacy/location (including street and city if local pharmacy) is medication to be sent to? [id number] - [place], [place] - [address]  3. Do they need a 30 day or 90 day supply? 90 days

## 2019-09-11 NOTE — Telephone Encounter (Signed)
Called patient to get a better understanding of his refill request I noticed that we sent out  A refill on 08/20/2019 but patient states the pharmacy didn't get it. I called the pharmacy and she confirmed the same thing and I verbally gave consent for the refill. I also informed the pt that it is time to schedule a in office appointment to see dr. Claiborne Billings.

## 2019-09-13 ENCOUNTER — Other Ambulatory Visit: Payer: Self-pay | Admitting: Cardiovascular Disease

## 2019-11-13 ENCOUNTER — Other Ambulatory Visit: Payer: Self-pay | Admitting: Cardiovascular Disease

## 2019-11-13 NOTE — Telephone Encounter (Signed)
Prescription refill request for Eliquis received. Indication: DVT Last office visit: Appt 01/2020 Kelly Scr: 1.2 08/2018 (needs labs) Age: 79 Weight:  86.2 kg  Prescription refilled

## 2019-11-30 ENCOUNTER — Other Ambulatory Visit: Payer: Self-pay | Admitting: Cardiovascular Disease

## 2020-01-05 ENCOUNTER — Other Ambulatory Visit: Payer: Self-pay | Admitting: Cardiovascular Disease

## 2020-01-27 ENCOUNTER — Ambulatory Visit: Payer: Medicare Other | Admitting: Cardiovascular Disease

## 2020-01-27 ENCOUNTER — Other Ambulatory Visit: Payer: Self-pay | Admitting: Cardiovascular Disease

## 2020-01-30 NOTE — Progress Notes (Signed)
Cardiology Office Note:    Date:  02/01/2020   ID:  Pedro, Kent 03-26-1940, MRN [id number]  PCP:  Pedro Morning, DO  Cardiologist:  Pedro Majestic, MD  Electrophysiologist:  None   Referring MD: Pedro Morning, DO   Chief Complaint: follow-up of hypertension  History of Present Illness:    Pedro Kent is a 79 y.o. male with a history of DVT in 2007 on chronic anticoagulation with Eliquis, hypertension, hyperlipidemia, adrenal hyperplasia, prostate cancer treated with radiation seeds who is followed by Pedro Kent and presents today for routine follow-up.   Most recent Echo in 09/2012 showed LVEF of 60-65% with normal wall motion and mild LVH.   Pedro Kent has been following patient for management of hypertension. He is on a multi-drug regimen. At last visit with Pedro Kent in 04/2018, Lopressor was increased to 75mg  daily (25mg  in the AM and 50pm in PM), Valsartan was changed to 160mg  twice daily, Spironolactone was decreased to 12.5mg  daily in the absence of edema, and Amlodipine 10mg  daily was continued. Patient was last seen by Doreene Adas, PA-C, for a virtual visit in 07/2018 for follow-up. BP had been in the 130's/70's. No medication changes were made.   Patient presents today for follow-up. Here alone. He reports doing well since last visit. His BP is soft today at 96/60. He notes very brief episodes of lightheadedness and dizziness with quick position changes but otherwise asymptomatic with this. No falls or syncope. No chest pain, shortness of breath, orthopnea, PND, or edema. He is trying to stay active by walking at least 30 minutes every day. No exertional symptoms. He does have ongoing tobacco use and currently reports smoking 5 cigars every day.    Past Medical History:  Diagnosis Date  . Adrenal hyperplasia ([place])   . Cancer [facility]) 2010   prostate cancer  . DVT (deep venous thrombosis) ([place])   . Hyperlipidemia   . Hypertension     Past Surgical History:  Procedure  Laterality Date  . 2-D echocardiogram  03/10/2009   Normal left ventricular systolic function. Mild MR. Normal RV systolic pressure. Mild pulmonic valvular regurgitation.  . Cardiac stress test  04/05/2010   Negative for ischemia    Current Medications: Current Meds  Medication Sig  . amLODipine (NORVASC) 10 MG tablet Take 1 tablet (10 mg total) by mouth daily. NEED OV.  Marland Kitchen atorvastatin (LIPITOR) 10 MG tablet TAKE 1 TABLET (10 MG TOTAL) BY MOUTH DAILY. PT OVERDUE FOR OV PLEASE CALL FOR APPT  . diazepam (VALIUM) 5 MG tablet Take 1 tablet (5 mg total) by mouth 2 (two) times daily.  Marland Kitchen ELIQUIS 5 MG TABS tablet TAKE 1 TABLET BY MOUTH TWICE A DAY  . Green Tea, Camillia sinensis, (GREEN TEA PO) Take by mouth. Cup of Tea daily.  . meclizine (ANTIVERT) 25 MG tablet Take 1 tablet (25 mg total) by mouth 3 (three) times daily as needed for dizziness.  Marland Kitchen MELATONIN GUMMIES PO Take 5 mg by mouth daily.  . metoprolol tartrate (LOPRESSOR) 50 MG tablet TAKE 25 MG (0.5 TABLET) IN THE Kent, AND 50 MG (1 TABLET) IN THE EVENING  . Misc Natural Products (MENS PROSTATE HEALTH FORMULA PO) Take 4 capsules by mouth daily.  . Multiple Vitamins-Minerals (MULTIVITAMIN) tablet Take 1 tablet by mouth daily.  Marland Kitchen omeprazole (PRILOSEC) 20 MG capsule Take 20 mg by mouth daily as needed (heartburn).   . tamsulosin (FLOMAX) 0.4 MG CAPS capsule Take 1 capsule (0.4 mg total)  by mouth 2 (two) times daily.  . valsartan (DIOVAN) 320 MG tablet TAKE HALF (160 MG) IN THE Kent AND THE OTHER HALF (160 MG) IN THE EVENING  . venlafaxine XR (EFFEXOR-XR) 75 MG 24 hr capsule Take 150 mg by mouth in the Kent.  . [DISCONTINUED] aspirin EC 81 MG tablet Take 81 mg by mouth daily.  . [DISCONTINUED] spironolactone (ALDACTONE) 25 MG tablet Take 0.5 tablets (12.5 mg total) by mouth every Kent. KEEP OV.     Allergies:   Ace inhibitors   Social History   Socioeconomic History  . Marital status: Divorced    Spouse name: Not on file  .  Number of children: Not on file  . Years of education: Not on file  . Highest education level: Not on file  Occupational History  . Not on file  Tobacco Use  . Smoking status: Current Every Day Smoker    Packs/day: 0.25    Types: Cigars  . Smokeless tobacco: Never Used  Substance and Sexual Activity  . Alcohol use: Yes    Comment: rarely  . Drug use: Not on file  . Sexual activity: Not on file  Other Topics Concern  . Not on file  Social History Narrative  . Not on file   Social Determinants of Health   Financial Resource Strain: Not on file  Food Insecurity: Not on file  Transportation Needs: Not on file  Physical Activity: Not on file  Stress: Not on file  Social Connections: Not on file     Family History: The patient's family history is not on file.  ROS:   Please see the history of present illness.     EKGs/Labs/Other Studies Reviewed:    The following studies were reviewed today:  Echocardiogram 09/23/2012: Study Conclusions: - Left ventricle: The cavity size was normal. Wall thickness  was increased in a pattern of mild LVH. There was moderate  concentric hypertrophy. Systolic function was normal. The  estimated ejection fraction was in the range of 60% to  65%. Wall motion was normal; there were no regional wall  motion abnormalities.  - Mitral valve: Calcified annulus. Mildly thickened leaflets  - Atrial septum: No defect or patent foramen ovale was  identified.  - Pulmonary arteries: PA peak pressure: 61mm Hg (S).   EKG:  EKG ordered today. EKG personally reviewed and demonstrates normal sinus rhythm, rate 78 bpm, with biphasic T wave in V3-V6 and mild T wave inversions in I and aVL. These T wave changes have been seen on prior tracings. Also has possible Q waves in leads V1-V2 (seen on prior tracings) as well as in leads III and aVF (new). Left axis deviation. Normal PR and QRS intervals. QTc 394 ms.  Recent Labs: No results found for  requested labs within last 8760 hours.  Recent Lipid Panel    Component Value Date/Time   CHOL 135 05/16/2016 1127   TRIG 72 05/16/2016 1127   HDL 47 05/16/2016 1127   CHOLHDL 2.9 05/16/2016 1127   VLDL 14 05/16/2016 1127   LDLCALC 74 05/16/2016 1127    Physical Exam:    Vital Signs: BP 96/60 (BP Location: Left Arm, Patient Position: Sitting)   Pulse 78   Ht 6' (1.829 m)   Wt 196 lb 9.6 oz (89.2 kg)   SpO2 98%   BMI 26.66 kg/m     Wt Readings from Last 3 Encounters:  02/01/20 196 lb 9.6 oz (89.2 kg)  08/11/18 190 lb (86.2 kg)  04/21/18 190 lb 9.6 oz (86.5 kg)     General: 79 y.o. male in no acute distress. HEENT: Normocephalic and atraumatic. Sclera clear.  Neck: Supple. No carotid bruits. No JVD. Heart: RRR. Distinct S1 and S2. No murmurs, gallops, or rubs. Radial pulses 2+ and equal bilaterally. Lungs: No increased work of breathing. Clear to ausculation bilaterally. No wheezes, rhonchi, or rales.  Abdomen: Soft, non-distended, and non-tender to palpation.  Extremities: No lower extremity edema.    Skin: Warm and dry. Neuro: Alert and oriented x3. No focal deficits. Psych: Normal affect. Responds appropriately.   Assessment:    1. Primary hypertension   2. Hyperlipidemia, unspecified hyperlipidemia type   3. History of DVT (deep vein thrombosis)   4. Tobacco abuse     Plan:    Hypertension - BP soft today at 96/60. - Current medications: Amlodipine 10mg  daily, Valsartan 160mg  twice daily, Spironolactone 12.5mg  daily, and Lopressor 25mg  in the AM and 50mg  in the PM.  - Will stop Spironolactone. Continue other medications for now. Will have patient keep BP/HR log for x1 weeks and then return log when he comes for labs in 1 week. - Will check CMET when he returns for lab visit in 1 week.  Hyperlipidemia - Most recent lipid panel: Total Cholesterol 135, Triglycerides 43, HDL 55, LDL 76.  - Continue Lipitor 10mg  daily.  - Patient is not fasting today. Will  check lipid panel and CMET in 1 week.  DVT - History of DVT of left lower extremity in 2007. Has been on chronic anticoagulation since that time.  - No lower extremity edema on pain. - Continue Eliquis 5mg  twice daily.  - Will check CBC today when he returns for lab visit in 1 week.  Tobacco Abuse - Patient smokes 5 cigars daily.  - Discussed importance of complete cessations. Patient states some days he is willing to quit and other days he is not. Patient to let us know if he needs any assistance (nicotine gum/patches, etc.)  Disposition: Follow up in 6 months with Pedro Kent.   Medication Adjustments/Labs and Tests Ordered: Current medicines are reviewed at length with the patient today.  Concerns regarding medicines are outlined above.  Orders Placed This Encounter  Procedures  . Comprehensive metabolic panel  . Lipid panel  . CBC  . EKG 12-Lead   No orders of the defined types were placed in this encounter.   Patient Instructions  Medication Instructions:  Stop Spironolactone. Continue other Medications as Directed. *If you need a refill on your cardiac medications before your next appointment, please call your pharmacy*   Lab Work: CMP, Lipid, CBC (Week of December 20th) If you have labs (blood work) drawn today and your tests are completely normal, you will receive your results only by: Marland Kitchen MyChart Message (if you have MyChart) OR . A paper copy in the mail If you have any lab test that is abnormal or we need to change your treatment, we will call you to review the results.   Testing/Procedures: None    Follow-Up: At [facility], you and your health needs are our priority.  As part of our continuing mission to provide you with exceptional heart care, we have created designated Provider Care Teams.  These Care Teams include your primary Cardiologist (physician) and Advanced Practice Providers (APPs -  Physician Assistants and Nurse Practitioners) who all work  together to provide you with the care you need, when you need it.  Your next appointment:  6 month(s)  The format for your next appointment:   In Person  Provider:   Shelva Majestic, MD   Other Instructions  Check blood Pressure and Heart Rate for 1 Week. Log Daily Drop off at [employer] when  Coming in for Blood work.    Signed, Darreld Mclean, PA-C  02/01/2020 12:47 PM    [facility]

## 2020-02-01 ENCOUNTER — Encounter: Payer: Self-pay | Admitting: Student

## 2020-02-01 ENCOUNTER — Other Ambulatory Visit: Payer: Self-pay

## 2020-02-01 ENCOUNTER — Ambulatory Visit (INDEPENDENT_AMBULATORY_CARE_PROVIDER_SITE_OTHER): Payer: Medicare Other | Admitting: Student

## 2020-02-01 VITALS — BP 96/60 | HR 78 | Ht 72.0 in | Wt 196.6 lb

## 2020-02-01 DIAGNOSIS — Z720 Tobacco use: Secondary | ICD-10-CM | POA: Diagnosis not present

## 2020-02-01 DIAGNOSIS — I10 Essential (primary) hypertension: Secondary | ICD-10-CM

## 2020-02-01 DIAGNOSIS — E785 Hyperlipidemia, unspecified: Secondary | ICD-10-CM

## 2020-02-01 DIAGNOSIS — Z86718 Personal history of other venous thrombosis and embolism: Secondary | ICD-10-CM | POA: Diagnosis not present

## 2020-02-01 NOTE — Patient Instructions (Signed)
Medication Instructions:  Stop Spironolactone. Continue other Medications as Directed. *If you need a refill on your cardiac medications before your next appointment, please call your pharmacy*   Lab Work: CMP, Lipid, CBC (Week of December 20th) If you have labs (blood work) drawn today and your tests are completely normal, you will receive your results only by:  [place] (if you have MyChart) OR  A paper copy in the mail If you have any lab test that is abnormal or we need to change your treatment, we will call you to review the results.   Testing/Procedures: None    Follow-Up: At [facility], you and your health needs are our priority.  As part of our continuing mission to provide you with exceptional heart care, we have created designated Provider Care Teams.  These Care Teams include your primary Cardiologist (physician) and Advanced Practice Providers (APPs -  Physician Assistants and Nurse Practitioners) who all work together to provide you with the care you need, when you need it.  Your next appointment:   6 month(s)  The format for your next appointment:   In Person  Provider:   Shelva Majestic, MD   Other Instructions  Check blood Pressure and Heart Rate for 1 Week. Log Daily Drop off at [employer] when  Coming in for Blood work.

## 2020-02-09 DIAGNOSIS — I10 Essential (primary) hypertension: Secondary | ICD-10-CM | POA: Diagnosis not present

## 2020-02-09 DIAGNOSIS — Z86718 Personal history of other venous thrombosis and embolism: Secondary | ICD-10-CM | POA: Diagnosis not present

## 2020-02-09 DIAGNOSIS — E785 Hyperlipidemia, unspecified: Secondary | ICD-10-CM | POA: Diagnosis not present

## 2020-02-10 ENCOUNTER — Other Ambulatory Visit: Payer: Self-pay | Admitting: Cardiovascular Disease

## 2020-02-10 LAB — COMPREHENSIVE METABOLIC PANEL
ALT: 29 IU/L (ref 0–44)
AST: 19 IU/L (ref 0–40)
Albumin/Globulin Ratio: 1.8 (ref 1.2–2.2)
Albumin: 4.1 g/dL (ref 3.6–4.6)
Alkaline Phosphatase: 91 IU/L (ref 44–121)
BUN/Creatinine Ratio: 14 (ref 10–24)
BUN: 16 mg/dL (ref 8–27)
Bilirubin Total: 0.4 mg/dL (ref 0.0–1.2)
CO2: 26 mmol/L (ref 20–29)
Calcium: 9.3 mg/dL (ref 8.6–10.2)
Chloride: 107 mmol/L — ABNORMAL HIGH (ref 96–106)
Creatinine, Ser: 1.15 mg/dL (ref 0.76–1.27)
GFR calc Af Amer: 69 mL/min/{1.73_m2} (ref >59)
GFR calc non Af Amer: 59 mL/min/{1.73_m2} — ABNORMAL LOW (ref >59)
Globulin, Total: 2.3 g/dL (ref 1.5–4.5)
Glucose: 101 mg/dL — ABNORMAL HIGH (ref 65–99)
Potassium: 4.6 mmol/L (ref 3.5–5.2)
Sodium: 145 mmol/L — ABNORMAL HIGH (ref 134–144)
Total Protein: 6.4 g/dL (ref 6.0–8.5)

## 2020-02-10 LAB — LIPID PANEL
Chol/HDL Ratio: 2.4 ratio (ref 0.0–5.0)
Cholesterol, Total: 118 mg/dL (ref 100–199)
HDL: 50 mg/dL (ref >39)
LDL Chol Calc (NIH): 56 mg/dL (ref 0–99)
Triglycerides: 51 mg/dL (ref 0–149)
VLDL Cholesterol Cal: 12 mg/dL (ref 5–40)

## 2020-02-10 LAB — CBC
Hematocrit: 40.2 % (ref 37.5–51.0)
Hemoglobin: 13.9 g/dL (ref 13.0–17.7)
MCH: 35.1 pg — ABNORMAL HIGH (ref 26.6–33.0)
MCHC: 34.6 g/dL (ref 31.5–35.7)
MCV: 102 fL — ABNORMAL HIGH (ref 79–97)
Platelets: 187 10*3/uL (ref 150–450)
RBC: 3.96 x10E6/uL — ABNORMAL LOW (ref 4.14–5.80)
RDW: 12.3 % (ref 11.6–15.4)
WBC: 4.8 10*3/uL (ref 3.4–10.8)

## 2020-02-24 ENCOUNTER — Other Ambulatory Visit: Payer: Self-pay | Admitting: Cardiovascular Disease

## 2020-03-10 DIAGNOSIS — Z8546 Personal history of malignant neoplasm of prostate: Secondary | ICD-10-CM | POA: Diagnosis not present

## 2020-03-10 DIAGNOSIS — R351 Nocturia: Secondary | ICD-10-CM | POA: Diagnosis not present

## 2020-03-10 DIAGNOSIS — R3912 Poor urinary stream: Secondary | ICD-10-CM | POA: Diagnosis not present

## 2020-03-10 DIAGNOSIS — R3915 Urgency of urination: Secondary | ICD-10-CM | POA: Diagnosis not present

## 2020-04-07 ENCOUNTER — Other Ambulatory Visit: Payer: Self-pay | Admitting: Cardiovascular Disease

## 2020-04-24 ENCOUNTER — Other Ambulatory Visit: Payer: Self-pay | Admitting: Cardiovascular Disease

## 2020-05-13 DIAGNOSIS — Z79899 Other long term (current) drug therapy: Secondary | ICD-10-CM | POA: Diagnosis not present

## 2020-05-13 DIAGNOSIS — R635 Abnormal weight gain: Secondary | ICD-10-CM | POA: Diagnosis not present

## 2020-05-13 DIAGNOSIS — R5383 Other fatigue: Secondary | ICD-10-CM | POA: Diagnosis not present

## 2020-05-13 DIAGNOSIS — R42 Dizziness and giddiness: Secondary | ICD-10-CM | POA: Diagnosis not present

## 2020-05-19 DIAGNOSIS — F418 Other specified anxiety disorders: Secondary | ICD-10-CM | POA: Diagnosis not present

## 2020-05-19 DIAGNOSIS — R419 Unspecified symptoms and signs involving cognitive functions and awareness: Secondary | ICD-10-CM | POA: Diagnosis not present

## 2020-05-29 ENCOUNTER — Other Ambulatory Visit: Payer: Self-pay | Admitting: Cardiovascular Disease

## 2020-06-14 ENCOUNTER — Other Ambulatory Visit: Payer: Self-pay | Admitting: Cardiovascular Disease

## 2020-06-22 DIAGNOSIS — F418 Other specified anxiety disorders: Secondary | ICD-10-CM | POA: Diagnosis not present

## 2020-06-22 DIAGNOSIS — I10 Essential (primary) hypertension: Secondary | ICD-10-CM | POA: Diagnosis not present

## 2020-06-22 DIAGNOSIS — E7800 Pure hypercholesterolemia, unspecified: Secondary | ICD-10-CM | POA: Diagnosis not present

## 2020-06-22 DIAGNOSIS — Z0000 Encounter for general adult medical examination without abnormal findings: Secondary | ICD-10-CM | POA: Diagnosis not present

## 2020-07-20 ENCOUNTER — Telehealth: Payer: Self-pay | Admitting: Cardiovascular Disease

## 2020-07-20 NOTE — Telephone Encounter (Signed)
PT IS CALLING IN BECAUSE HE IS EXPERIENCING FATIGUE AND LIGHT HEADEDNESS, PT WOULD LIKE TO KNOW IF MEDICINES ARE POSSIBLY CAUSING THIS ISSUE.

## 2020-07-20 NOTE — Telephone Encounter (Signed)
Spoke with pt, he is complaining of lightheadedness and feeling tired. He reports it is there all the time and does not change with movement or position. He reports he has had vertigo before. His bp is running 131/70, 125/69 and 139/73. Advised patient did not feel it was related to his bp and encouraged him to try claratin or zyrtec OTC. He has a follow up appointment 08-03-20 with dr Claiborne Billings and will bring his bp readings to that appointment. Pt agreed with this plan.

## 2020-08-03 ENCOUNTER — Other Ambulatory Visit: Payer: Self-pay

## 2020-08-03 ENCOUNTER — Encounter: Payer: Self-pay | Admitting: Cardiovascular Disease

## 2020-08-03 ENCOUNTER — Ambulatory Visit: Payer: Medicare Other | Admitting: Cardiovascular Disease

## 2020-08-03 VITALS — BP 120/60 | HR 74 | Ht 72.0 in | Wt 190.4 lb

## 2020-08-03 DIAGNOSIS — I10 Essential (primary) hypertension: Secondary | ICD-10-CM | POA: Diagnosis not present

## 2020-08-03 DIAGNOSIS — K219 Gastro-esophageal reflux disease without esophagitis: Secondary | ICD-10-CM | POA: Diagnosis not present

## 2020-08-03 DIAGNOSIS — Z7901 Long term (current) use of anticoagulants: Secondary | ICD-10-CM | POA: Diagnosis not present

## 2020-08-03 DIAGNOSIS — Z86718 Personal history of other venous thrombosis and embolism: Secondary | ICD-10-CM | POA: Diagnosis not present

## 2020-08-03 NOTE — Progress Notes (Signed)
Patient ID: Pedro Kent, male   DOB: [birth year], 80 y.o.   MRN: [id number]    Primary MD: Dr. Tommy Medal  HPI: Pedro Kent is a 80 y.o. male who presents for a 17 month follow-up cardiology evaluation.  Pedro Kent developed a  deep vein thrombosis of his left lower extremity in January 2007and has been on chronic anticoagulation therapy with Coumadin ever since. Additional problems include hypertension with adrenal hyperplasia, history of prostate CA treated with history of radiation seeds,  hyperlipidemia, and an abnormal ECG with T wave inversion. An echo Doppler study in January 2011  showed mild concentric LVH, mild MR with possible torn redundant chordae tendinea, mild aortic valve sclerosis, and mild coronary insufficiency.   On 09/23/2012 an echo Doppler study showed an ejection fraction at 60-65%. Pedro Kent had mild left ventricular hypertrophy and had normal diastolic parameters on this present study. There is very minimal pulmonary pressure elevation at 31 mm. Is mitral valve is mildly thickened with no significant regurgitation.  When I saw him Pedro Kent was on a multiple drug drug regimen for hypertension, consisting of amlodipine 10 mg, metoprolol 50 mg in the am and 25 mg in the pm, spironolactone 25 mg daily and valsartan 320 mg.   his blood pressure was elevated and I recommended that Pedro Kent increase spironolactone and take 25 mg in the morning and 12.5 mg at night.  Ifblood pressure continued to be elevated, then Pedro Kent will increase this to 25 mg twice a day.  Over the past several months, Pedro Kent states his blood pressure has improved.  Pedro Kent will be establishing new primary care with Dr. Maudie Mercury.  Pedro Kent has seen Dr. Karsten Ro for his prostate CA.  Pedro Kent continues to take Prilosec for GERD but this has been stable and Pedro Kent takes rarely.  Pedro Kent is taking atorvastatin 10 mg for hyperlipidemia.  Pedro Kent is on chronic warfarin therapy with his clot history.  Pedro Kent denies recent bleeding.  Pedro Kent continues to smoke several small cigars  per day.  Pedro Kent denies any chest pain, PND, orthopnea.   I last saw him in March 2020 and since his prior evaluation in September 2018 Pedro Kent had some issues with vertigo.  Pedro Kent  was evaluated in the emergency room was not felt to have a stroke.  Pedro Kent had an MRI that was negative for an acute stroke.  Was able to walk without difficulty.  Due to symptoms from his peripheral vertigo Pedro Kent was instructed on the Epley maneuver.  Pedro Kent was also reevaluated in the emergency room setting for depression.  Pedro Kent has seen Dr. Theda Sers in Dr. Julianne Rice absence and Pedro Kent has had some medication adjustment eluding slight reduction in his beta-blocker therapy.  Pedro Kent now has been taking amlodipine 10 mg, metoprolol 25 mg twice a day, and has been taking spironolactone 37-1/2 mg in the morning in addition to valsartan 320 mg daily.  Pedro Kent is no longer on warfarin but is now on Eliquis for anticoagulation.  Pedro Kent continues to be on atorvastatin for hyperlipidemia.  Pedro Kent has history of prostate CA which she is seen Dr. Karsten Ro.  Pedro Kent continued to be on Flonase.  His GERD was controlled with Prilosec.  Since I last saw him, Pedro Kent denies any chest pain, shortness of breath, or palpitations.  Pedro Kent has experienced some fatigue.  Pedro Kent was recently started on some Zyrtec for head congestion.  His blood pressure has been stable at home.  Pedro Kent denies any significant leg swelling.  Pedro Kent continues to be on atorvastatin  10 mg, metoprolol tartrate 25 mg in the morning and 50 mg in the evening, valsartan 320 mg daily attention.  Pedro Kent is on Eliquis for anticoagulation 5 mg twice daily.  Pedro Kent is now followed by Dr. Janie Morning since Dr. Maudie Mercury has retired.  Pedro Kent presents for evaluation.   Past Medical History:  Diagnosis Date   Adrenal hyperplasia ([place])    Cancer ([place]) 2010   prostate cancer   DVT (deep venous thrombosis) ([place])    Hyperlipidemia    Hypertension     Past Surgical History:  Procedure Laterality Date   2-D echocardiogram  03/10/2009   Normal left ventricular systolic  function. Mild MR. Normal RV systolic pressure. Mild pulmonic valvular regurgitation.   Cardiac stress test  04/05/2010   Negative for ischemia    Allergies  Allergen Reactions   Ace Inhibitors Other (See Comments)    Unknown    Current Outpatient Medications  Medication Sig Dispense Refill   amLODipine (NORVASC) 10 MG tablet TAKE 1 TABLET (10 MG TOTAL) BY MOUTH DAILY. NEED OV. 90 tablet 3   atorvastatin (LIPITOR) 10 MG tablet TAKE 1 TABLET (10 MG TOTAL) BY MOUTH DAILY. PT OVERDUE FOR OV PLEASE CALL FOR APPT 90 tablet 3   ELIQUIS 5 MG TABS tablet TAKE 1 TABLET BY MOUTH TWICE A DAY 180 tablet 1   Green Tea, Camillia sinensis, (GREEN TEA PO) Take by mouth. Cup of Tea daily.     MELATONIN GUMMIES PO Take 5 mg by mouth daily.     metoprolol tartrate (LOPRESSOR) 50 MG tablet TAKE 25 MG (0.5 TABLET) IN THE MORNING, AND 50 MG (1 TABLET) IN THE EVENING 135 tablet 1   Misc Natural Products (MENS PROSTATE HEALTH FORMULA PO) Take 4 capsules by mouth daily.     Multiple Vitamins-Minerals (MULTIVITAMIN) tablet Take 1 tablet by mouth daily.     omeprazole (PRILOSEC) 20 MG capsule Take 20 mg by mouth daily as needed (heartburn).      tamsulosin (FLOMAX) 0.4 MG CAPS capsule Take 1 capsule (0.4 mg total) by mouth 2 (two) times daily. 90 capsule 0   valsartan (DIOVAN) 320 MG tablet TAKE HALF (160 MG) IN THE MORNING AND THE OTHER HALF (160 MG) IN THE EVENING 90 tablet 1   venlafaxine XR (EFFEXOR-XR) 37.5 MG 24 hr capsule Take 37.5 mg by mouth daily.     No current facility-administered medications for this visit.    Social History   Socioeconomic History   Marital status: Divorced    Spouse name: Not on file   Number of children: Not on file   Years of education: Not on file   Highest education level: Not on file  Occupational History   Not on file  Tobacco Use   Smoking status: Every Day    Packs/day: 0.25    Pack years: 0.00    Types: Cigars, Cigarettes   Smokeless tobacco: Never   Substance and Sexual Activity   Alcohol use: Yes    Comment: rarely   Drug use: Not on file   Sexual activity: Not on file  Other Topics Concern   Not on file  Social History Narrative   Not on file   Social Determinants of Health   Financial Resource Strain: Not on file  Food Insecurity: Not on file  Transportation Needs: Not on file  Physical Activity: Not on file  Stress: Not on file  Social Connections: Not on file  Intimate Partner Violence: Not on file  Socially Pedro Kent has 2 children one grandchild. Pedro Kent does walk. Pedro Kent denies recent alcohol use.  No family history on file.   ROS General: Negative; No fevers, chills, or night sweats;  HEENT: Negative; No changes in vision or hearing, sinus congestion, difficulty swallowing Pulmonary: Negative; No cough, wheezing, shortness of breath, hemoptysis Cardiovascular: See history of present illness GI: positive for GERD on omeprazole No nausea, vomiting, diarrhea, or abdominal pain GU: History of prostate CA Musculoskeletal: Negative; no myalgias, joint pain, or weakness Hematologic/Oncology: Negative; no easy bruising, bleeding Endocrine: Negative; no heat/cold intolerance; no diabetes Neuro:vertigo Skin: Negative; No rashes or skin lesions Psychiatric: Pression Sleep: Negative; No snoring, daytime sleepiness, hypersomnolence, bruxism, restless legs, hypnogognic hallucinations, no cataplexy Other comprehensive 14 point system review is negative.    PE BP 120/60 (BP Location: Right Arm)   Pulse 74   Ht 6' (1.829 m)   Wt 190 lb 6.4 oz (86.4 kg)   BMI 25.82 kg/m     Repeat blood pressure by me was 128/62  Wt Readings from Last 3 Encounters:  08/03/20 190 lb 6.4 oz (86.4 kg)  02/01/20 196 lb 9.6 oz (89.2 kg)  08/11/18 190 lb (86.2 kg)   General: Alert, oriented, no distress.  Skin: normal turgor, no rashes, warm and dry HEENT: Normocephalic, atraumatic. Pupils equal round and reactive to light; sclera anicteric;  extraocular muscles intact;  Nose without nasal septal hypertrophy Mouth/Parynx benign; Mallinpatti scale 3 Neck: No JVD, no carotid bruits; normal carotid upstroke Lungs: clear to ausculatation and percussion; no wheezing or rales Chest wall: without tenderness to palpitation Heart: PMI not displaced, RRR, s1 s2 normal, 1/6 systolic murmur, no diastolic murmur, no rubs, gallops, thrills, or heaves Abdomen: soft, nontender; no hepatosplenomehaly, BS+; abdominal aorta nontender and not dilated by palpation. Back: no CVA tenderness Pulses 2+ Musculoskeletal: full range of motion, normal strength, no joint deformities Extremities: no clubbing cyanosis or edema, Homan's sign negative  Neurologic: grossly nonfocal; Cranial nerves grossly wnl Psychologic: Normal mood and affect   ECG (independently read by me): NSR at 74, sinus arrhythmia  ; PRWP V1 23 April 2018 ECG (independently read by me): Sinus rhythm at 95 bpm with occasional PAC.  Poor anterior R wave progression.  Lateral T wave abnormality.  September 2018 ECG (independently read by me): Sinus tachycardia 59 bpm.  Poor progression V1 through V3.  Normal intervals.  No ectopy.  March 2018 ECG (independently read by me): Normal sinus rhythm at 61 bpm with mild sinus arrhythmia.  Poor R wave progression.  Previously noted inferolateral T changes.  May 2017 ECG (independently read by me): Normal sinus rhythm with occasional PACs.  Previously noted inferolateral T wave abnormality.  QTc interval 410 ms.  December 2016 ECG (independently read by me):  Normal sinus rhythm with an occasional PAC. Previously noted inferolateral T wave inversion  November 2015 ECG (independently read by me): Normal sinus rhythm at 62 bpm.  Poor R-wave progression previously noted T-wave inversion inferolaterally  Prior September 2014 ECG: Sinus rhythm at 57 beats a minute. Previously noted T-wave abnormalities inferolaterally.  LABS: BMP Latest Ref Rng &  Units 02/09/2020 08/27/2018 10/08/2017  Glucose 65 - 99 mg/dL 101(H) 85 112(H)  BUN 8 - 27 mg/dL '16 18 18  ' Creatinine 0.76 - 1.27 mg/dL 1.15 1.20 1.25(H)  BUN/Creat Ratio 10 - '24 14 15 ' -  Sodium 134 - 144 mmol/L 145(H) 138 135  Potassium 3.5 - 5.2 mmol/L 4.6 4.8 3.8  Chloride 96 -  106 mmol/L 107(H) 101 100  CO2 20 - 29 mmol/L '26 22 25  ' Calcium 8.6 - 10.2 mg/dL 9.3 9.6 9.3   Hepatic Function Latest Ref Rng & Units 02/09/2020 10/08/2017 05/16/2016  Total Protein 6.0 - 8.5 g/dL 6.4 7.1 6.8  Albumin 3.6 - 4.6 g/dL 4.1 4.3 4.0  AST 0 - 40 IU/L '19 22 23  ' ALT 0 - 44 IU/L '29 27 28  ' Alk Phosphatase 44 - 121 IU/L 91 59 65  Total Bilirubin 0.0 - 1.2 mg/dL 0.4 1.6(H) 0.6   CBC Latest Ref Rng & Units 02/09/2020 10/08/2017 09/03/2017  WBC 3.4 - 10.8 x10E3/uL 4.8 4.5 5.1  Hemoglobin 13.0 - 17.7 g/dL 13.9 14.2 13.9  Hematocrit 37.5 - 51.0 % 40.2 39.2 41.1  Platelets 150 - 450 x10E3/uL 187 172 169   Lab Results  Component Value Date   MCV 102 (H) 02/09/2020   MCV 95.4 10/08/2017   MCV 101.0 (H) 09/03/2017   Lab Results  Component Value Date   TSH 0.91 05/16/2016  No results found for: HGBA1C   Lipid Panel     Component Value Date/Time   CHOL 118 02/09/2020 1237   TRIG 51 02/09/2020 1237   HDL 50 02/09/2020 1237   CHOLHDL 2.4 02/09/2020 1237   CHOLHDL 2.9 05/16/2016 1127   VLDL 14 05/16/2016 1127   LDLCALC 56 02/09/2020 1237   IMPRESSION:  1. Essential hypertension   2. Primary hypertension   3. History of DVT (deep vein thrombosis)   4. Gastroesophageal reflux disease without esophagitis   5. Anticoagulated      ASSESSMENT AND PLAN: Pedro Kent is an 80 year-old African-American gentleman who is 15 years s/p developing his deep vein thrombosis in January 2007.  chronic Coumadin therapy which since I had last seen him what has been switched to Eliquis which Pedro Kent currently takes 5 mg twice a day.  Past Pedro Kent has had significant issues with blood pressure elevation and required a 4 drug  regimen.  Presently his blood pressure is stable.  Pedro Kent continues to be on amlodipine 10 mg, valsartan 320 mg daily (currently taking 160 mg twice daily), and metoprolol tartrate 25 mg in the morning and 50 mg at night.  Pedro Kent is unaware of any palpitations or shortness of breath.  Repeat blood pressure by me was 128/62.  Remotely had been on spironolactone but Pedro Kent is no longer on this presently.  Pedro Kent continues to be on low-dose atorvastatin 10 mg for hyperlipidemia.  LDL cholesterol in December 2021 was 56.  Renal function in March 2022 was excellent at 1.05.  BMI remained stable and ideal at 25.8.  Pedro Kent continues to be on omeprazole for his GERD which is controlled.  Pedro Kent recently was started on Zyrtec for head congestion.  Pedro Kent denies bleeding on his current regimen of Eliquis 5 mg twice a day.  Dr. Theda Sers will be checking laboratory at her next office visit.  As long as Pedro Kent remains stable I will see him in 1 year for reevaluation or sooner as necessary.   Troy Sine, MD, [facility]  08/05/2020 3:17 PM

## 2020-08-03 NOTE — Patient Instructions (Signed)

## 2020-08-15 ENCOUNTER — Other Ambulatory Visit: Payer: Self-pay | Admitting: Cardiovascular Disease

## 2020-08-15 NOTE — Telephone Encounter (Signed)
41m, 86.4kg, scr 1.15 02/09/20, lovw/kelly 08/04/19

## 2020-09-15 ENCOUNTER — Other Ambulatory Visit: Payer: Self-pay | Admitting: Cardiovascular Disease

## 2020-11-14 ENCOUNTER — Ambulatory Visit: Payer: Self-pay

## 2020-11-14 NOTE — Telephone Encounter (Signed)
Called pt regarding medication interactions. Unable to answer pt's question. Pt will call PCP office directly.  Gave pt phone number to Dr. Janie Morning. [phone]

## 2020-11-24 ENCOUNTER — Telehealth: Payer: Self-pay | Admitting: Cardiovascular Disease

## 2020-11-24 NOTE — Telephone Encounter (Signed)
Called patient- he was advised of his lab work- and requested that [facility] take a look at it and his medications- I asked him if he wanted an appointment and he said no. I did advise we may have to repeat blood work to see where we are, but would have her review and get her opinion- appointment was December 2021.  Thanks!

## 2020-11-24 NOTE — Telephone Encounter (Signed)
Called patient- advised of response from PA- patient verbalized that he had stopped the spironolactone-  He will call back if he would like to recheck blood work.  Thanks!

## 2020-11-24 NOTE — Telephone Encounter (Signed)
His lab work really looked okay. Sodium level wast just one unit above upper limit of normal which may have just been because he was a little dehydrated. I reviewed his medications. None of his medications should cause that (although do see that Spironolactone is on his list but I thought we stopped that - can we confirm this?). We can repeat a BMET if he would like but I don't think it is necessary.  Thanks os much!

## 2020-11-24 NOTE — Telephone Encounter (Signed)
Patient calling for his lab results back in 01/2020. He says he wants to know about the sodium level.

## 2020-12-11 ENCOUNTER — Other Ambulatory Visit: Payer: Self-pay | Admitting: Cardiovascular Disease

## 2020-12-19 DIAGNOSIS — F418 Other specified anxiety disorders: Secondary | ICD-10-CM | POA: Diagnosis not present

## 2020-12-19 DIAGNOSIS — E7800 Pure hypercholesterolemia, unspecified: Secondary | ICD-10-CM | POA: Diagnosis not present

## 2020-12-19 DIAGNOSIS — I10 Essential (primary) hypertension: Secondary | ICD-10-CM | POA: Diagnosis not present

## 2020-12-19 DIAGNOSIS — K219 Gastro-esophageal reflux disease without esophagitis: Secondary | ICD-10-CM | POA: Diagnosis not present

## 2021-02-06 ENCOUNTER — Other Ambulatory Visit: Payer: Self-pay | Admitting: Cardiovascular Disease

## 2021-02-06 NOTE — Telephone Encounter (Signed)
Prescription refill request for Eliquis received. Indication:DVT Last office visit:6/22 Scr:1.1 Age: 80 Weight:86.4 kg  Prescription refilled

## 2021-02-21 ENCOUNTER — Other Ambulatory Visit: Payer: Self-pay | Admitting: Cardiovascular Disease

## 2021-03-05 ENCOUNTER — Other Ambulatory Visit: Payer: Self-pay | Admitting: Cardiovascular Disease

## 2021-03-21 DIAGNOSIS — K219 Gastro-esophageal reflux disease without esophagitis: Secondary | ICD-10-CM | POA: Diagnosis not present

## 2021-03-21 DIAGNOSIS — I10 Essential (primary) hypertension: Secondary | ICD-10-CM | POA: Diagnosis not present

## 2021-03-21 DIAGNOSIS — E7800 Pure hypercholesterolemia, unspecified: Secondary | ICD-10-CM | POA: Diagnosis not present

## 2021-03-21 DIAGNOSIS — F418 Other specified anxiety disorders: Secondary | ICD-10-CM | POA: Diagnosis not present

## 2021-04-25 ENCOUNTER — Other Ambulatory Visit: Payer: Self-pay | Admitting: Cardiovascular Disease

## 2021-05-10 ENCOUNTER — Other Ambulatory Visit: Payer: Self-pay | Admitting: Cardiovascular Disease

## 2021-05-17 DIAGNOSIS — Z8546 Personal history of malignant neoplasm of prostate: Secondary | ICD-10-CM | POA: Diagnosis not present

## 2021-05-23 DIAGNOSIS — R351 Nocturia: Secondary | ICD-10-CM | POA: Diagnosis not present

## 2021-05-23 DIAGNOSIS — N401 Enlarged prostate with lower urinary tract symptoms: Secondary | ICD-10-CM | POA: Diagnosis not present

## 2021-05-23 DIAGNOSIS — Z8546 Personal history of malignant neoplasm of prostate: Secondary | ICD-10-CM | POA: Diagnosis not present

## 2021-06-09 ENCOUNTER — Other Ambulatory Visit: Payer: Self-pay | Admitting: Cardiovascular Disease

## 2021-06-12 NOTE — Telephone Encounter (Signed)
Prescription refill request for Eliquis received. ?Indication:DVT ?Last office visit:6/22 ?Scr:1.0 ?Age: 81 ?Weight:86.4 kg ? ?Prescription refilled ? ?

## 2021-11-03 ENCOUNTER — Telehealth: Payer: Self-pay | Admitting: Cardiovascular Disease

## 2021-11-03 NOTE — Telephone Encounter (Signed)
Called patient, advised that he has been having some increased blood pressure readings since August. He states that he was taking Myrbetriq but was told by his pharmacy this interacts with his Metoprolol so he stopped the Myrbetriq.   He is taking Metoprolol 25 AM, 50 PM  Amlodipine 10 mg daily  Valsartan 160 AM, 160 PM   He has had no changes to medications.   He would like to know what needs to be done he is having tiredness/fatigue, and occasional blurred vision.   I did advise he was overdue for an appointment- so I made him an appointment with a PA on 09/21- I did advise to continue his medications, continue to keep a BP log and bring to his upcoming appointment. I would call back with any other recommendations. However, he does know Dr.Kelly is out of office- and covering provider may answer.

## 2021-11-03 NOTE — Telephone Encounter (Signed)
Pt c/o BP issue: STAT if pt c/o blurred vision, one-sided weakness or slurred speech  1. What are your last 5 BP readings?   9/09: 161/79 82 9/9 148/73  149/79  8/07: 134/78          140/85          151/83  2. Are you having any other symptoms (ex. Dizziness, headache, blurred vision, passed out)? Tiredness/fatigue, balance a little off, occasional blurred vision   3. What is your BP issue?   Patient states his BP has been elevated. His pharmacy advised him Metoprolol may interact with Myrbetriq and he stopped taking it.

## 2021-11-09 ENCOUNTER — Encounter: Payer: Self-pay | Admitting: Physician Assistant

## 2021-11-09 ENCOUNTER — Ambulatory Visit: Payer: Medicare Other | Attending: Physician Assistant | Admitting: Physician Assistant

## 2021-11-09 VITALS — BP 136/70 | HR 80 | Ht 72.0 in | Wt 193.0 lb

## 2021-11-09 DIAGNOSIS — I10 Essential (primary) hypertension: Secondary | ICD-10-CM | POA: Diagnosis not present

## 2021-11-09 DIAGNOSIS — R5383 Other fatigue: Secondary | ICD-10-CM | POA: Diagnosis not present

## 2021-11-09 DIAGNOSIS — Z86718 Personal history of other venous thrombosis and embolism: Secondary | ICD-10-CM

## 2021-11-09 DIAGNOSIS — E785 Hyperlipidemia, unspecified: Secondary | ICD-10-CM

## 2021-11-09 NOTE — Progress Notes (Signed)
Cardiology Office Note:    Date:  11/11/2021   ID:  Pedro Kent, Pedro Kent [birth year], MRN [id number]  PCP:  Janie Morning, [facility] Cardiologist:  Shelva Majestic, MD     Referring MD: Janie Morning, DO   Chief Complaint  Patient presents with   Follow-up    12 months.   Headache   Fatigue        Blurred Vision    History of Present Illness:    Pedro Kent is a 81 y.o. male with a hx of hypertension, hyperlipidemia, prostate cancer s/p radiation seed, adrenal hyperplasia and history of left lower extremity DVT in January 2007.  Echocardiogram in January 2011 showed mild LVH, mild MR, possible torn redundant chordae tendon 90, mild aortic valve sclerosis and mild coronary insufficiency.  Repeat echocardiogram obtained in August 2014 showed EF 60 to 65%, mild LVH, normal diastolic parameter, PASP 31 mmHg, mitral valve was mildly thickened but no significant regurgitation.  He used to be on Coumadin however later switched to Eliquis.  Patient was last seen by Dr. Claiborne Billings on 08/03/2020 at which time he was doing well.  Patient presents today for follow-up.  He is primary concern is his blood pressure and fatigue.  Although he initially was concerned that his blood pressure might be a little bit high recently, however based on the blood pressure diary he brought with him, most of the time his blood pressure has been in the 130s with occasional spike into the 140s and 150s.  According to the patient, he has not been taking spironolactone in over 2 years.  He is dizzy spell started 2 years ago after started on the medication of Effexor.  Effexor dose was later decreased.  He says he is dizzy all the time and symptom does not change with body positions.  Again his primary concern is still fatigue.  He denies any chest pain or worsening shortness of breath with exertion.  He has been feeling quite fatigued over the past several months.  He only drinks 2 cup of water, I  asked him to keep fluid hydration between 32 to 64 ounces per day.  I also recommended repeat some blood work to make sure there is no other causes for his fatigue.  Blood pressure tend to be higher in the afternoon, I moved his amlodipine to morning time instead of nighttime.  He is concerned that his fatigue might be related to beta-blocker, I recommended 1 week trial where he reduce the metoprolol to 25 mg twice a day.  If his fatigue is on changed, then he will need to go back on the previous dose of 25 mg a.m. and the 50 mg p.m. dose of metoprolol.  I plan to see him back in 6 weeks for reassessment.   Past Medical History:  Diagnosis Date   Adrenal hyperplasia ([place])    Cancer ([place]) 2010   prostate cancer   DVT (deep venous thrombosis) ([place])    Hyperlipidemia    Hypertension     Past Surgical History:  Procedure Laterality Date   2-D echocardiogram  03/10/2009   Normal left ventricular systolic function. Mild MR. Normal RV systolic pressure. Mild pulmonic valvular regurgitation.   Cardiac stress test  04/05/2010   Negative for ischemia    Current Medications: Current Meds  Medication Sig   amLODipine (NORVASC) 10 MG tablet TAKE 1 TABLET (10 MG TOTAL) BY MOUTH DAILY. NEED OV.   atorvastatin (  LIPITOR) 10 MG tablet Take 1 tablet (10 mg total) by mouth daily. Please have patient give our office a call for yearly appt.   ELIQUIS 5 MG TABS tablet TAKE 1 TABLET BY MOUTH TWICE A DAY   Green Tea, Camillia sinensis, (GREEN TEA PO) Take by mouth. Cup of Tea daily.   MELATONIN GUMMIES PO Take 5 mg by mouth daily.   metoprolol tartrate (LOPRESSOR) 50 MG tablet TAKE 25 MG (0.5 TABLET) IN THE MORNING, AND 50 MG (1 TABLET) IN THE EVENING   Misc Natural Products (MENS PROSTATE HEALTH FORMULA PO) Take 4 capsules by mouth daily.   Multiple Vitamins-Minerals (MULTIVITAMIN) tablet Take 1 tablet by mouth daily.   omeprazole (PRILOSEC) 20 MG capsule Take 20 mg by mouth daily as needed (heartburn).     tamsulosin (FLOMAX) 0.4 MG CAPS capsule Take 1 capsule (0.4 mg total) by mouth 2 (two) times daily.   valsartan (DIOVAN) 320 MG tablet TAKE HALF (160 MG) IN THE MORNING AND THE OTHER HALF (160 MG) IN THE EVENING     Allergies:   Ace inhibitors   Social History   Socioeconomic History   Marital status: Divorced    Spouse name: Not on file   Number of children: Not on file   Years of education: Not on file   Highest education level: Not on file  Occupational History   Not on file  Tobacco Use   Smoking status: Every Day    Packs/day: 0.25    Types: Cigars, Cigarettes   Smokeless tobacco: Never  Substance and Sexual Activity   Alcohol use: Yes    Comment: rarely   Drug use: Not on file   Sexual activity: Not on file  Other Topics Concern   Not on file  Social History Narrative   Not on file   Social Determinants of Health   Financial Resource Strain: Not on file  Food Insecurity: Not on file  Transportation Needs: Not on file  Physical Activity: Not on file  Stress: Not on file  Social Connections: Not on file     Family History: The patient's family history is not on file.  ROS:   Please see the history of present illness.     All other systems reviewed and are negative.  EKGs/Labs/Other Studies Reviewed:    The following studies were reviewed today:  Echo 09/23/2012 LV EF: 60% -   65%   ------------------------------------------------------------  Indications:      Hypertension - benign without CHF 402.10.     ------------------------------------------------------------  History:   Risk factors:  Abnormal ECG, history of DVT  Current tobacco use. Hypertension. Dyslipidemia.   ------------------------------------------------------------  Study Conclusions   - Left ventricle: The cavity size was normal. Wall thickness    was increased in a pattern of mild LVH. There was moderate    concentric hypertrophy. Systolic function was normal. The    estimated  ejection fraction was in the range of 60% to    65%. Wall motion was normal; there were no regional wall    motion abnormalities.  - Mitral valve: Calcified annulus. Mildly thickened leaflets    .  - Atrial septum: No defect or patent foramen ovale was    identified.  - Pulmonary arteries: PA peak pressure: 58m Hg (S).   EKG:  EKG is ordered today.  The ekg ordered today demonstrates normal sinus rhythm, poor R wave progression in the anterior leads  Recent Labs: 11/09/2021: ALT 27; BUN 16; Creatinine,  Ser 1.15; Hemoglobin 14.8; Platelets 172; Potassium 4.9; Sodium 140; TSH 1.040  Recent Lipid Panel    Component Value Date/Time   CHOL 118 02/09/2020 1237   TRIG 51 02/09/2020 1237   HDL 50 02/09/2020 1237   CHOLHDL 2.4 02/09/2020 1237   CHOLHDL 2.9 05/16/2016 1127   VLDL 14 05/16/2016 1127   LDLCALC 56 02/09/2020 1237     Risk Assessment/Calculations:           Physical Exam:    VS:  BP 136/70 (BP Location: Right Arm, Patient Position: Sitting, Cuff Size: Normal)   Pulse 80   Ht 6' (1.829 m)   Wt 193 lb (87.5 kg)   BMI 26.18 kg/m        Wt Readings from Last 3 Encounters:  11/09/21 193 lb (87.5 kg)  08/03/20 190 lb 6.4 oz (86.4 kg)  02/01/20 196 lb 9.6 oz (89.2 kg)     GEN:  Well nourished, well developed in no acute distress HEENT: Normal NECK: No JVD; No carotid bruits LYMPHATICS: No lymphadenopathy CARDIAC: RRR, no murmurs, rubs, gallops RESPIRATORY:  Clear to auscultation without rales, wheezing or rhonchi  ABDOMEN: Soft, non-tender, non-distended MUSCULOSKELETAL:  No edema; No deformity  SKIN: Warm and dry NEUROLOGIC:  Alert and oriented x 3 PSYCHIATRIC:  Normal affect   ASSESSMENT:    1. Fatigue, unspecified type   2. Primary hypertension   3. Hyperlipidemia, unspecified hyperlipidemia type   4. History of DVT (deep vein thrombosis)    PLAN:    In order of problems listed above:  Fatigue: Symptom actually has been going on for the past 2  years, initially started after he took Effexor, however persisted even after Effexor dose was reduced.  She suspected some of it may be related to beta-blocker, I asked him to do a 1 week trial of reducing the beta-blocker to twice a day dosing.  Obtain blood work to rule out secondary causes behind fatigue  Hypertension: I recommended a move amlodipine to morning time to help cover elevated blood pressure in the afternoon better  Hyperlipidemia: Continue Lipitor  History of DVT: On Eliquis.           Medication Adjustments/Labs and Tests Ordered: Current medicines are reviewed at length with the patient today.  Concerns regarding medicines are outlined above.  Orders Placed This Encounter  Procedures   CBC   Comprehensive metabolic panel   Hemoglobin A1c   TSH   EKG 12-Lead   No orders of the defined types were placed in this encounter.   Patient Instructions  Medication Instructions:  TAKE Amlodipine 10 mg at nighttime/bedtime  *If you need a refill on your cardiac medications before your next appointment, please call your pharmacy*  Lab Work: Your physician recommends that you return for lab work TODAY:  CBC CMP Hemoglobin A1c  TSH  If you have labs (blood work) drawn today and your tests are completely normal, you will receive your results only by: [place] (if you have MyChart) OR A paper copy in the mail If you have any lab test that is abnormal or we need to change your treatment, we will call you to review the results.  Testing/Procedures: NONE ordered at this time of appointment   Follow-Up: At [facility], you and your health needs are our priority.  As part of our continuing mission to provide you with exceptional heart care, we have created designated Provider Care Teams.  These Care Teams include your primary  Cardiologist (physician) and Advanced Practice Providers (APPs -  Physician Assistants and Nurse Practitioners) who all work together  to provide you with the care you need, when you need it.   Your next appointment:   6 week(s)  The format for your next appointment:   In Person  Provider:   Almyra Deforest, PA-C        Other Instructions CONTINUE to monitor blood pressure at home. Your Systolic (top number) should be between the range of 110-130. If your top number is consistently greater than 150 please give our office a call.   Important Information About Sugar         Hilbert Corrigan, [place]  11/11/2021 11:52 PM    [place]

## 2021-11-09 NOTE — Patient Instructions (Addendum)
Medication Instructions:  TAKE Amlodipine 10 mg at nighttime/bedtime  *If you need a refill on your cardiac medications before your next appointment, please call your pharmacy*  Lab Work: Your physician recommends that you return for lab work TODAY:  CBC CMP Hemoglobin A1c  TSH  If you have labs (blood work) drawn today and your tests are completely normal, you will receive your results only by: [place] (if you have MyChart) OR A paper copy in the mail If you have any lab test that is abnormal or we need to change your treatment, we will call you to review the results.  Testing/Procedures: NONE ordered at this time of appointment   Follow-Up: At [facility], you and your health needs are our priority.  As part of our continuing mission to provide you with exceptional heart care, we have created designated Provider Care Teams.  These Care Teams include your primary Cardiologist (physician) and Advanced Practice Providers (APPs -  Physician Assistants and Nurse Practitioners) who all work together to provide you with the care you need, when you need it.   Your next appointment:   6 week(s)  The format for your next appointment:   In Person  Provider:   Almyra Deforest, PA-C        Other Instructions CONTINUE to monitor blood pressure at home. Your Systolic (top number) should be between the range of 110-130. If your top number is consistently greater than 150 please give our office a call.   Important Information About Sugar

## 2021-11-10 LAB — HEMOGLOBIN A1C
Est. average glucose Bld gHb Est-mCnc: 120 mg/dL
Hgb A1c MFr Bld: 5.8 % — ABNORMAL HIGH (ref 4.8–5.6)

## 2021-11-10 LAB — COMPREHENSIVE METABOLIC PANEL
ALT: 27 IU/L (ref 0–44)
AST: 20 IU/L (ref 0–40)
Albumin/Globulin Ratio: 1.7 (ref 1.2–2.2)
Albumin: 4.5 g/dL (ref 3.7–4.7)
Alkaline Phosphatase: 94 IU/L (ref 44–121)
BUN/Creatinine Ratio: 14 (ref 10–24)
BUN: 16 mg/dL (ref 8–27)
Bilirubin Total: 0.6 mg/dL (ref 0.0–1.2)
CO2: 24 mmol/L (ref 20–29)
Calcium: 9.8 mg/dL (ref 8.6–10.2)
Chloride: 101 mmol/L (ref 96–106)
Creatinine, Ser: 1.15 mg/dL (ref 0.76–1.27)
Globulin, Total: 2.6 g/dL (ref 1.5–4.5)
Glucose: 114 mg/dL — ABNORMAL HIGH (ref 70–99)
Potassium: 4.9 mmol/L (ref 3.5–5.2)
Sodium: 140 mmol/L (ref 134–144)
Total Protein: 7.1 g/dL (ref 6.0–8.5)
eGFR: 63 mL/min/{1.73_m2} (ref >59)

## 2021-11-10 LAB — CBC
Hematocrit: 42.8 % (ref 37.5–51.0)
Hemoglobin: 14.8 g/dL (ref 13.0–17.7)
MCH: 35 pg — ABNORMAL HIGH (ref 26.6–33.0)
MCHC: 34.6 g/dL (ref 31.5–35.7)
MCV: 101 fL — ABNORMAL HIGH (ref 79–97)
Platelets: 172 10*3/uL (ref 150–450)
RBC: 4.23 x10E6/uL (ref 4.14–5.80)
RDW: 12.6 % (ref 11.6–15.4)
WBC: 6.1 10*3/uL (ref 3.4–10.8)

## 2021-11-10 LAB — TSH: TSH: 1.04 u[IU]/mL (ref 0.450–4.500)

## 2021-11-11 ENCOUNTER — Encounter: Payer: Self-pay | Admitting: Physician Assistant

## 2021-11-16 NOTE — Telephone Encounter (Signed)
Seen in office 09/21- upcoming appointment 10/30

## 2021-12-06 ENCOUNTER — Telehealth: Payer: Self-pay | Admitting: Cardiovascular Disease

## 2021-12-06 NOTE — Telephone Encounter (Signed)
Pt c/o BP issue: STAT if pt c/o blurred vision, one-sided weakness or slurred speech  1. What are your last 5 BP readings?  10/17: 163/88, 160/92, 155/88   2. Are you having any other symptoms (ex. Dizziness, headache, blurred vision, passed out)?  Blurred vision, fatigue, lightheadedness   3. What is your BP issue?   Patient states his BP has been fluctuating-elevated causing blurred vision, fatigue.

## 2021-12-06 NOTE — Telephone Encounter (Signed)
Returned call to patient,  patient reports ongoing symptoms of fatigue, drowsiness, lightheadedness, and mood swings since appointment with PA.   He reports symptoms are not any better or any worse.   He states he reduced the metoprolol as recommended and did not notice any change in symptoms.   BP readings yesterday as reported (see previous phone note).   Labs drawn at last appt-unremarkable.   Patient has not discussed with PCP.   Patient has upcoming appointment with Almyra Deforest PA for reassessment.   Offered appt tomorrow with Dr. Vanessa Barbara to come d/t transportation.    Advised would route to [facility] PA to review.

## 2021-12-08 NOTE — Telephone Encounter (Signed)
Patient is calling to check up on status of his call. He would like a call back.

## 2021-12-08 NOTE — Telephone Encounter (Signed)
Patient stated he wanted to f/u on his call from 2 days ago. He reports fatigue, drowsiness, lightheadedness, mood swings, blurred vision, and muscle aches. BP ranges from 138/80 to 154/109. Denies chest pain or sob. He has not seen PCP. Please advise on symptoms.

## 2021-12-12 NOTE — Telephone Encounter (Signed)
Reviewed with Almyra Deforest.  Have patient start chlorthalidone 25 mg once daily.  Keep appointment with Isaac Laud for next week.  Remind patient that medications usually take 3-5 days to start lowering BP.  If he has questionable symptoms before his appointment, he should go to ED

## 2021-12-12 NOTE — Telephone Encounter (Signed)
Follow Up:     Patient is calling back asking for Sarah. He says he feels really, really bad. He says he is having the same symptoms he had on Friday.Marland Kitchenl

## 2021-12-12 NOTE — Telephone Encounter (Signed)
Received stat call from patient who states that he is still experiencing the same symptoms as he was on Friday. Reports that his BP today was 154/88 with HR 94. Patient states his BP has been elevated for several weeks now. Patient states that he has had intermittent blurred vision, lightheadedness, fatigue and mood swings. Patient denies any chest pain, shortness of breath, or palpitations. Patient does report that his HR was been up to 106 as well recently. Patient states he does take all of his medications as prescribed. Advised patient of PharmD's recommendations- patient would like to wait on starting Chlorthalidone and see someone in the office tomorrow to discuss symptoms. Scheduled patient to see Diona Browner, NP tomorrow at 2:45pm- patient states he will have someone bring him to this. Advised patient of ED precautions should new or worsening symptoms develop, also educated patient on stroke symptoms as well. Patient is aware of all instructions and verbalized understanding. Will forward to MD to make aware as well as Diona Browner, NP.

## 2021-12-13 ENCOUNTER — Encounter: Payer: Self-pay | Admitting: Nurse Practitioner

## 2021-12-13 ENCOUNTER — Ambulatory Visit: Payer: Medicare Other | Attending: Nurse Practitioner | Admitting: Nurse Practitioner

## 2021-12-13 VITALS — BP 138/80 | HR 94 | Ht 79.0 in | Wt 193.6 lb

## 2021-12-13 DIAGNOSIS — R5383 Other fatigue: Secondary | ICD-10-CM | POA: Diagnosis not present

## 2021-12-13 DIAGNOSIS — R42 Dizziness and giddiness: Secondary | ICD-10-CM | POA: Diagnosis not present

## 2021-12-13 DIAGNOSIS — E785 Hyperlipidemia, unspecified: Secondary | ICD-10-CM

## 2021-12-13 DIAGNOSIS — H538 Other visual disturbances: Secondary | ICD-10-CM

## 2021-12-13 DIAGNOSIS — Z86718 Personal history of other venous thrombosis and embolism: Secondary | ICD-10-CM

## 2021-12-13 DIAGNOSIS — I10 Essential (primary) hypertension: Secondary | ICD-10-CM

## 2021-12-13 MED ORDER — CHLORTHALIDONE 25 MG PO TABS: 25.0000 mg | ORAL_TABLET | Freq: Every day | ORAL | 3 refills | Status: DC

## 2021-12-13 NOTE — Patient Instructions (Addendum)
Medication Instructions:  .Start Chlorthalidone 25 mg daily   *If you need a refill on your cardiac medications before your next appointment, please call your pharmacy*   Lab Work: NONE ordered at this time of appointment   If you have labs (blood work) drawn today and your tests are completely normal, you will receive your results only by: [place] (if you have MyChart) OR A paper copy in the mail If you have any lab test that is abnormal or we need to change your treatment, we will call you to review the results.   Testing/Procedures: NONE ordered at this time of appointment     Follow-Up: At [facility], you and your health needs are our priority.  As part of our continuing mission to provide you with exceptional heart care, we have created designated Provider Care Teams.  These Care Teams include your primary Cardiologist (physician) and Advanced Practice Providers (APPs -  Physician Assistants and Nurse Practitioners) who all work together to provide you with the care you need, when you need it.  We recommend signing up for the patient portal called "MyChart".  Sign up information is provided on this After Visit Summary.  MyChart is used to connect with patients for Virtual Visits (Telemedicine).  Patients are able to view lab/test results, encounter notes, upcoming appointments, etc.  Non-urgent messages can be sent to your provider as well.   To learn more about what you can do with MyChart, go to [email or web address].    Your next appointment:   1 month(s)  The format for your next appointment:   In Person  Provider:   Almyra Deforest, PA-C or Diona Browner, NP        Other Instructions   Important Information About Sugar

## 2021-12-13 NOTE — Progress Notes (Signed)
Office Visit    Patient Name: Pedro Kent Date of Encounter: 12/13/2021  Primary Care Provider:  Janie Morning, DO Primary Cardiologist:  Shelva Majestic, MD  Chief Complaint    81 year old male with a history of hypertension, hyperlipidemia, DVT, prostate cancer s/p radiation seed, and adrenal hyperplasia who presents for follow-up related to hypertension and fatigue.   Past Medical History    Past Medical History:  Diagnosis Date   Adrenal hyperplasia ([place])    Cancer ([place]) 2010   prostate cancer   DVT (deep venous thrombosis) ([place])    Hyperlipidemia    Hypertension    Past Surgical History:  Procedure Laterality Date   2-D echocardiogram  03/10/2009   Normal left ventricular systolic function. Mild MR. Normal RV systolic pressure. Mild pulmonic valvular regurgitation.   Cardiac stress test  04/05/2010   Negative for ischemia    Allergies  Allergies  Allergen Reactions   Ace Inhibitors Other (See Comments)    Unknown    History of Present Illness    81 year old male with the above past medical history including hypertension, hyperlipidemia, DVT, prostate cancer s/p radiation seed, and adrenal hyperplasia.  He is on Eliquis for history of prior DVT.  He has followed with Dr. Claiborne Billings primarily for the management of hypertension.  Most recent echocardiogram in 09/2012 showed EF 60 to 65%, normal wall motion, mild LVH.  He was last seen in the office on 11/09/2021 and noted a 2-year history of fatigue with more recent dizziness and elevated BP.  It was felt that his Effexor was contributing to his fatigue as well as possibly his beta-blocker.  He was asked to reduce his beta-blocker and transition his amlodipine to morning time use to help cover elevated afternoon BP.  Labs including CBC, CMET, A1c, and TSH were unremarkable.  He contacted our office on 12/06/2021 and reported minimally elevated BP with associated blurred vision, fatigue, lightheadedness, muscle aches, and  mood swings.  He was advised to start chlorthalidone 25 mg daily and to go to the ED for further evaluation should his symptoms worsen.  He presents today for follow-up accompanied by his sister.  Since his last visit he has been stable overall from a cardiac standpoint. He states he stopped taking Effexor approximately 6 months ago but since that time he has continued to note ongoing lightheadedness, fatigue, cough, and intermittent blurred vision. His BP has been elevated with BP consistently in the 150s. HR has been stable. He denies any chest pain, dyspnea, edema, PND, orthopnea, weight gain, denies presyncope, syncope.  Denies any sick contacts.  Other than his ongoing lightheadedness, fatigue, cough, intermittent blurred vision, and elevated BP, he denies any additional concerns today.  Home Medications    Current Outpatient Medications  Medication Sig Dispense Refill   amLODipine (NORVASC) 10 MG tablet TAKE 1 TABLET (10 MG TOTAL) BY MOUTH DAILY. NEED OV. 90 tablet 3   atorvastatin (LIPITOR) 10 MG tablet Take 1 tablet (10 mg total) by mouth daily. Please have patient give our office a call for yearly appt. 90 tablet 3   chlorthalidone (HYGROTON) 25 MG tablet Take 1 tablet (25 mg total) by mouth daily. 90 tablet 3   ELIQUIS 5 MG TABS tablet TAKE 1 TABLET BY MOUTH TWICE A DAY 180 tablet 1   Green Tea, Camillia sinensis, (GREEN TEA PO) Take by mouth. Cup of Tea daily.     MELATONIN GUMMIES PO Take 5 mg by mouth daily.  metoprolol tartrate (LOPRESSOR) 50 MG tablet TAKE 25 MG (0.5 TABLET) IN THE MORNING, AND 50 MG (1 TABLET) IN THE EVENING 135 tablet 3   tamsulosin (FLOMAX) 0.4 MG CAPS capsule Take 1 capsule (0.4 mg total) by mouth 2 (two) times daily. 90 capsule 0   valsartan (DIOVAN) 320 MG tablet TAKE HALF (160 MG) IN THE MORNING AND THE OTHER HALF (160 MG) IN THE EVENING 90 tablet 3   Misc Natural Products (MENS PROSTATE HEALTH FORMULA PO) Take 4 capsules by mouth daily. (Patient not taking:  Reported on 12/13/2021)     Multiple Vitamins-Minerals (MULTIVITAMIN) tablet Take 1 tablet by mouth daily.     omeprazole (PRILOSEC) 20 MG capsule Take 20 mg by mouth daily as needed (heartburn).  (Patient not taking: Reported on 12/13/2021)     No current facility-administered medications for this visit.     Review of Systems    He denies chest pain, palpitations, dyspnea, pnd, orthopnea, n, v, syncope, edema, weight gain, or early satiety. All other systems reviewed and are otherwise negative except as noted above.   Physical Exam    VS:  BP 138/80   Pulse 94   Ht '6\' 7"'$  (2.007 m)   Wt 193 lb 9.6 oz (87.8 kg)   SpO2 98%   BMI 21.81 kg/m   GEN: Well nourished, well developed, in no acute distress. HEENT: normal. Neck: Supple, no JVD, carotid bruits, or masses. Cardiac: RRR, no murmurs, rubs, or gallops. No clubbing, cyanosis, edema.  Radials/DP/PT 2+ and equal bilaterally.  Respiratory:  Respirations regular and unlabored, clear to auscultation bilaterally. GI: Soft, nontender, nondistended, BS + x 4. MS: no deformity or atrophy. Skin: warm and dry, no rash. Neuro:  Strength and sensation are intact. Psych: Normal affect.  Accessory Clinical Findings    ECG personally reviewed by me today -no EKG in office today.  Lab Results  Component Value Date   WBC 6.1 11/09/2021   HGB 14.8 11/09/2021   HCT 42.8 11/09/2021   MCV 101 (H) 11/09/2021   PLT 172 11/09/2021   Lab Results  Component Value Date   CREATININE 1.15 11/09/2021   BUN 16 11/09/2021   NA 140 11/09/2021   K 4.9 11/09/2021   CL 101 11/09/2021   CO2 24 11/09/2021   Lab Results  Component Value Date   ALT 27 11/09/2021   AST 20 11/09/2021   ALKPHOS 94 11/09/2021   BILITOT 0.6 11/09/2021   Lab Results  Component Value Date   CHOL 118 02/09/2020   HDL 50 02/09/2020   LDLCALC 56 02/09/2020   TRIG 51 02/09/2020   CHOLHDL 2.4 02/09/2020    Lab Results  Component Value Date   HGBA1C 5.8 (H) 11/09/2021     Assessment & Plan    1. Hypertension: BP elevated above goal.  Will start chlorthalidone 25 mg daily.  Continue to monitor BP and report BP consistently greater than 130/80.  Will check BMET in 2 weeks. Otherwise, continue current antihypertensive regimen.   2. Fatigue/lightheadedness/intermittent blurred vision: Unclear etiology. Patient  is convinced that his symptoms are side effect of a current medication. It is possible that his symptoms could be related to elevated BP.  Will start chlorthalidone as above.  If symptoms continue despite adequate BP control, could consider statin holiday. Discussed ED precautions. Continue to monitor.  3. Hyperlipidemia: No recent LDL on file. LDL was 56 in 2021.  Consider repeat lipids, LFTs at follow-up.  For now, continue atorvastatin.  4. History of DVT: Continue Eliquis.  5. Disposition: Follow-up in 1 month.    Lenna Sciara, NP 12/13/2021, 5:38 PM

## 2021-12-18 ENCOUNTER — Ambulatory Visit: Payer: Medicare Other | Admitting: Physician Assistant

## 2021-12-25 ENCOUNTER — Telehealth: Payer: Self-pay | Admitting: Cardiovascular Disease

## 2021-12-25 NOTE — Telephone Encounter (Signed)
Made appointment with Vella Raring, NP for 12/26/21.

## 2021-12-25 NOTE — Telephone Encounter (Signed)
Patient stated his BP has come down with the chlorthalidone, but heart rate has increased. He stated he has cloudy vision (never had cataract surgery), feels tired, feels washed out, and has dizziness. He denies pain. He "wants relief." BP ranges from 129/74 to 135/75, P from 105 to 119. He asked for blood work results. Give per Janan Ridge, PA-C: "Normal red blood cell count. Stable renal function and electrolyte. Hgb A1C place the patient in the prediabetes range. Liver enzyme normal. Thyroid level normal."  Patient wants relief from the way he is feeling.

## 2021-12-25 NOTE — Telephone Encounter (Signed)
STAT if HR is under 50 or over 120 (normal HR is 60-100 beats per minute)  What is your heart rate? 105  Do you have a log of your heart rate readings (document readings)?  110 - Yesterday  Do you have any other symptoms? Cloudy vision, tired, feels washed out

## 2021-12-26 ENCOUNTER — Ambulatory Visit (HOSPITAL_BASED_OUTPATIENT_CLINIC_OR_DEPARTMENT_OTHER): Payer: Medicare Other | Admitting: Family

## 2021-12-26 ENCOUNTER — Encounter (HOSPITAL_BASED_OUTPATIENT_CLINIC_OR_DEPARTMENT_OTHER): Payer: Self-pay | Admitting: Family

## 2021-12-26 VITALS — BP 108/68 | HR 80 | Ht 72.0 in | Wt 192.9 lb

## 2021-12-26 DIAGNOSIS — I10 Essential (primary) hypertension: Secondary | ICD-10-CM

## 2021-12-26 DIAGNOSIS — R5383 Other fatigue: Secondary | ICD-10-CM | POA: Diagnosis not present

## 2021-12-26 DIAGNOSIS — Z86718 Personal history of other venous thrombosis and embolism: Secondary | ICD-10-CM | POA: Diagnosis not present

## 2021-12-26 DIAGNOSIS — R000 Tachycardia, unspecified: Secondary | ICD-10-CM | POA: Diagnosis not present

## 2021-12-26 DIAGNOSIS — D6859 Other primary thrombophilia: Secondary | ICD-10-CM

## 2021-12-26 DIAGNOSIS — R0683 Snoring: Secondary | ICD-10-CM

## 2021-12-26 DIAGNOSIS — R42 Dizziness and giddiness: Secondary | ICD-10-CM | POA: Diagnosis not present

## 2021-12-26 DIAGNOSIS — E782 Mixed hyperlipidemia: Secondary | ICD-10-CM

## 2021-12-26 DIAGNOSIS — Z720 Tobacco use: Secondary | ICD-10-CM

## 2021-12-26 DIAGNOSIS — G4730 Sleep apnea, unspecified: Secondary | ICD-10-CM

## 2021-12-26 NOTE — Progress Notes (Signed)
Office Visit    Patient Name: Pedro Kent Date of Encounter: 12/26/2021  PCP:  Janie Morning, [facility]  Cardiologist:  Shelva Majestic, MD  Advanced Practice Provider:  No care team member to display Electrophysiologist:  None      Chief Complaint    Pedro Kent is a 81 y.o. male presents today for tachycardia, lightheadedness, fatigue.  Past Medical History    Past Medical History:  Diagnosis Date   Adrenal hyperplasia ([place])    Cancer ([place]) 2010   prostate cancer   DVT (deep venous thrombosis) ([place])    Hyperlipidemia    Hypertension    Past Surgical History:  Procedure Laterality Date   2-D echocardiogram  03/10/2009   Normal left ventricular systolic function. Mild MR. Normal RV systolic pressure. Mild pulmonic valvular regurgitation.   Cardiac stress test  04/05/2010   Negative for ischemia    Allergies  Allergies  Allergen Reactions   Ace Inhibitors Other (See Comments)    Unknown    History of Present Illness    Pedro Kent is a 81 y.o. male with a hx of hypertension, hyperlipidemia, DVT, prostate cancer s/p radiation C, adrenal hyperplasia last seen 12/13/2021 by Diona Browner, NP.  Seen 11/09/2021 with a 2-year history of fatigue.  Patient attributed to Effexor.  He was offered 1 week trial of reducing metoprolol.  CBC no anemia, CMP normal electrolytes and liver, A1c prediabetic, TSH normal.  He did not notice any change with reduced dose of metoprolol.  Seen in clinic 12/13/2021 and started on chlorthalidone due to elevated blood pressure.  Recommended for follow-up labs in 2 weeks. He called the office yesterday noting heart rate of 105 and 110 associated with cloudy vision, tired, feeling washed out as well as dizziness.  Blood pressure at home 129/74-135/75.  He presents today for follow-up with his niece. He is a retired IT trainer for 30 years. He notes that he is tired, experiencing cloudy vision, fatigue,  and lightheadedness. These symptoms all started over a year ago but has worsened over the last month. He notes that the lightheadedness occurs daily in which is feels unbalanced. He endorses morning fatigue, has never received a sleep study, and does snore. He drinks 2 1/2 glasses of water a day and has not been exercising or walking due to fatigue. He does smoke cigars occasionally. He has seen a ophthalmologist but it's "been awhile." He supposed to wear daily glassess but he does not wear them. Denies amaurosis fugax. He notes that he has increased fatigue and increased heart rate since staring Chlorthalidone. He denis myalgias. He is very concerned all of his symptoms are related to medication.  Reports no shortness of breath and stable dyspnea on exertion. Reports no chest pain, pressure, or tightness. No edema, orthopnea, PND. Reports no palpitations.     EKGs/Labs/Other Studies Reviewed:   The following studies were reviewed today:  EKG:  EKG is ordered today.  The ekg ordered today demonstrates sinus rhythm 80bpm with premature atrial complexes. No acute ST/T wave changes.   Recent Labs: 11/09/2021: ALT 27; BUN 16; Creatinine, Ser 1.15; Hemoglobin 14.8; Platelets 172; Potassium 4.9; Sodium 140; TSH 1.040  Recent Lipid Panel    Component Value Date/Time   CHOL 118 02/09/2020 1237   TRIG 51 02/09/2020 1237   HDL 50 02/09/2020 1237   CHOLHDL 2.4 02/09/2020 1237   CHOLHDL 2.9 05/16/2016 1127   VLDL 14 05/16/2016  [place] 02/09/2020 1237    Home Medications   Current Meds  Medication Sig   amLODipine (NORVASC) 10 MG tablet TAKE 1 TABLET (10 MG TOTAL) BY MOUTH DAILY. NEED OV.   atorvastatin (LIPITOR) 10 MG tablet Take 1 tablet (10 mg total) by mouth daily. Please have patient give our office a call for yearly appt.   chlorthalidone (HYGROTON) 25 MG tablet Take 1 tablet (25 mg total) by mouth daily.   ELIQUIS 5 MG TABS tablet TAKE 1 TABLET BY MOUTH TWICE A DAY   Green Tea,  Camillia sinensis, (GREEN TEA PO) Take by mouth. Cup of Tea daily.   MELATONIN GUMMIES PO Take 5 mg by mouth daily.   metoprolol tartrate (LOPRESSOR) 50 MG tablet TAKE 25 MG (0.5 TABLET) IN THE MORNING, AND 50 MG (1 TABLET) IN THE EVENING   Misc Natural Products (MENS PROSTATE HEALTH FORMULA PO) Take 4 capsules by mouth daily.   Multiple Vitamins-Minerals (MULTIVITAMIN) tablet Take 1 tablet by mouth daily.   omeprazole (PRILOSEC) 20 MG capsule Take 20 mg by mouth daily as needed (heartburn).   tamsulosin (FLOMAX) 0.4 MG CAPS capsule Take 1 capsule (0.4 mg total) by mouth 2 (two) times daily.   valsartan (DIOVAN) 320 MG tablet TAKE HALF (160 MG) IN THE MORNING AND THE OTHER HALF (160 MG) IN THE EVENING    Review of Systems      All other systems reviewed and are otherwise negative except as noted above.  Physical Exam    VS:  BP 108/68 (BP Location: Left Arm, Patient Position: Sitting, Cuff Size: Normal)   Pulse 80   Ht 6' (1.829 m)   Wt 192 lb 14.4 oz (87.5 kg)   BMI 26.16 kg/m  , BMI Body mass index is 26.16 kg/m.  Wt Readings from Last 3 Encounters:  12/26/21 192 lb 14.4 oz (87.5 kg)  12/13/21 193 lb 9.6 oz (87.8 kg)  11/09/21 193 lb (87.5 kg)     GEN: Well nourished, overweight, well developed, in no acute distress. HEENT: normal. Neck: Supple, no JVD, carotid bruits, or masses. Cardiac: RRR, no murmurs, rubs, or gallops. No clubbing, cyanosis, edema.  Radials/PT 2+ and equal bilaterally.  Respiratory:  Respirations regular and unlabored, clear to auscultation bilaterally. GI: Soft, nontender, nondistended. MS: No deformity or atrophy. Skin: Warm and dry, no rash. Neuro:  Strength and sensation are intact. Psych: Normal affect.  Assessment & Plan    Hypertension- Clinic 108/68, at-home 120s-130s. Encouraged to bring BP cuff to next visit as not previously assessed for accuracy. BP well controlled. Continue current antihypertensive regimen.  Will contact office for BP  readings consistently over 130/80. Discussed to monitor BP at home at least 2 hours after medications and sitting for 5-10 minutes. Lightheadedness overall unchanged since starting Chlorthalidone, low suspicion hypotension contributory to lightheadedness.   Fatigue/Lightheadedness/Intermittent blurred vision - Has been on-going issues for over x1 year.  Cloudy vision: Plan to follow with his Ophthalmologist regarding vision - does not wear glasses presently prescribed.   Lightheadedness:Plan for CMP, Mag as Chlorthalidone was started on 12/13/21. Will increase fluid intake as dehydration can cause similar symptoms. Consider reducing dose Chlorthalidone pending labs.Will order ECHO to rule out valvular abnormality and  Carotid Duplex to rule out carotid stenosis as contributory to lightheadedness.  Fatigue: 10/2021 CBC, TSH, CMP unremarkable. Sleep study, as below.  HLD - Denies myalgias. 02/09/20 LDL 56. Plan for LDL direct. Continue Atorvastatin '10mg'$ .   Medication management -  He remains concerned that his symptoms are all related to his medications. Discussed that symptoms persist despite multiple medication changes will plan for further evaluation with labs, echo, carotid.  Hx of DVT / Hypercoagulable state - On Eliquis. Denies bleeding complications. Continue Eliquis '5mg'$ .   Snoring / Sleep Disordered Breathing: STOP-BANG: 6. Endorses morning fatigue, snoring. Home sleep study ordered.   Tachycardia: Home HR readings over the past week range from 90-115. 80bpm in office today. He denies palpitations, near syncope. He will increase fluid intake. Continue to monitor pulse and notify office for HR >115bpm.  Update BMP, as above.         Disposition: Follow up in 1 month(s) with Shelva Majestic, MD or APP.  Signed, Loel Dubonnet, NP 12/26/2021, 4:51 PM [facility]

## 2021-12-26 NOTE — Patient Instructions (Addendum)
Medication Instructions:  Continue your current medications.   We may consider adjustments depending on your lab work. We will have your lab results back tomorrow.   *If you need a refill on your cardiac medications before your next appointment, please call your pharmacy*  Lab Work: Your physician recommends that you return for lab work today: CMP, magnesium, direct LDL  If you have labs (blood work) drawn today and your tests are completely normal, you will receive your results only by: [place] (if you have MyChart) OR A paper copy in the mail If you have any lab test that is abnormal or we need to change your treatment, we will call you to review the results.  Testing/Procedures: Your physician has requested that you have an echocardiogram. Echocardiography is a painless test that uses sound waves to create images of your heart. It provides your doctor with information about the size and shape of your heart and how well your heart's chambers and valves are working. This procedure takes approximately one hour. There are no restrictions for this procedure. Please do NOT wear cologne, perfume, aftershave, or lotions (deodorant is allowed). Please arrive 15 minutes prior to your appointment time.  Your physician has requested that you have a carotid duplex. This test is an ultrasound of the carotid arteries in your neck. It looks at blood flow through these arteries that supply the brain with blood. Allow one hour for this exam. There are no restrictions or special instructions.  Your physician has recommended that you have a sleep study. This test records several body functions during sleep, including: brain activity, eye movement, oxygen and carbon dioxide blood levels, heart rate and rhythm, breathing rate and rhythm, the flow of air through your mouth and nose, snoring, body muscle movements, and chest and belly movement.   Follow-Up: At [facility], you and your health  needs are our priority.  As part of our continuing mission to provide you with exceptional heart care, we have created designated Provider Care Teams.  These Care Teams include your primary Cardiologist (physician) and Advanced Practice Providers (APPs -  Physician Assistants and Nurse Practitioners) who all work together to provide you with the care you need, when you need it.  We recommend signing up for the patient portal called "MyChart".  Sign up information is provided on this After Visit Summary.  MyChart is used to connect with patients for Virtual Visits (Telemedicine).  Patients are able to view lab/test results, encounter notes, upcoming appointments, etc.  Non-urgent messages can be sent to your provider as well.   To learn more about what you can do with MyChart, go to [email or web address].    Your next appointment:   1 month(s)  The format for your next appointment:   In Person  Provider:   Shelva Majestic, MD  or Advanced Practice Provider    Other Instructions  MyChart: Username: [email or web address]  Password: [email or web address]  Recommend increasing your fluid intake. Your heart rate is likely elevated due to dehydration.   Heart Healthy Diet Recommendations: A low-salt diet is recommended. Meats should be grilled, baked, or boiled. Avoid fried foods. Focus on lean protein sources like fish or chicken with vegetables and fruits. The American Heart Association is a Microbiologist!  American Heart Association Diet and Lifeystyle Recommendations   Exercise recommendations: The American Heart Association recommends 150 minutes of moderate intensity exercise weekly. Try 30 minutes of moderate intensity exercise 4-5 times per week. This could include walking, jogging,  or swimming.   Important Information About Sugar

## 2021-12-27 LAB — COMPREHENSIVE METABOLIC PANEL
ALT: 28 IU/L (ref 0–44)
AST: 24 IU/L (ref 0–40)
Albumin/Globulin Ratio: 1.7 (ref 1.2–2.2)
Albumin: 4.5 g/dL (ref 3.7–4.7)
Alkaline Phosphatase: 96 IU/L (ref 44–121)
BUN/Creatinine Ratio: 22 (ref 10–24)
BUN: 31 mg/dL — ABNORMAL HIGH (ref 8–27)
Bilirubin Total: 0.6 mg/dL (ref 0.0–1.2)
CO2: 25 mmol/L (ref 20–29)
Calcium: 9.7 mg/dL (ref 8.6–10.2)
Chloride: 98 mmol/L (ref 96–106)
Creatinine, Ser: 1.44 mg/dL — ABNORMAL HIGH (ref 0.76–1.27)
Globulin, Total: 2.6 g/dL (ref 1.5–4.5)
Glucose: 104 mg/dL — ABNORMAL HIGH (ref 70–99)
Potassium: 4.4 mmol/L (ref 3.5–5.2)
Sodium: 140 mmol/L (ref 134–144)
Total Protein: 7.1 g/dL (ref 6.0–8.5)
eGFR: 48 mL/min/{1.73_m2} — ABNORMAL LOW (ref >59)

## 2021-12-27 LAB — MAGNESIUM: Magnesium: 1.9 mg/dL (ref 1.6–2.3)

## 2021-12-27 LAB — LDL CHOLESTEROL, DIRECT: LDL Direct: 55 mg/dL (ref 0–99)

## 2021-12-28 ENCOUNTER — Telehealth (HOSPITAL_BASED_OUTPATIENT_CLINIC_OR_DEPARTMENT_OTHER): Payer: Self-pay

## 2021-12-28 DIAGNOSIS — R5383 Other fatigue: Secondary | ICD-10-CM

## 2021-12-28 DIAGNOSIS — R42 Dizziness and giddiness: Secondary | ICD-10-CM

## 2021-12-28 DIAGNOSIS — I10 Essential (primary) hypertension: Secondary | ICD-10-CM

## 2021-12-28 MED ORDER — CHLORTHALIDONE 25 MG PO TABS: 12.5000 mg | ORAL_TABLET | Freq: Every day | ORAL | 3 refills | Status: DC

## 2021-12-28 NOTE — Telephone Encounter (Signed)
Returned call to patient, provided the following recommendations. Patient states he is still having the same symptoms and this is his blood pressure from yesterday. RN will call and check on patient Monday to see if lower dose of medication helps at all. Patient agreeable to labs in one week and lab slips mailed to patient.    115/60- still having lightheadedness and dizziness

## 2021-12-28 NOTE — Telephone Encounter (Signed)
Lightheadedness/dizziness ongoing for 2 years and pre-dates addition of Chlorthalidone. See clinic note for details. Agree with plan as provided by RN.   Loel Dubonnet, NP

## 2021-12-28 NOTE — Telephone Encounter (Signed)
Follow Up:    Patient is retuning Kalia's call.

## 2021-12-28 NOTE — Telephone Encounter (Addendum)
Returned call to patient, provided the following recommendations. Patient states he is still having the same symptoms and this is his blood pressure from yesterday. RN will call and check on patient Monday to see if lower dose of medication helps at all. Patient agreeable to labs in one week and lab slips mailed to patient.    115/60- still having lightheadedness and dizziness     ----- Message from Loel Dubonnet, NP sent at 12/27/2021  5:00 PM EST ----- Normal liver, electrolytes.  LDL at goal of <70.  Kidney function decreased from previous. Likely due to chlorthalidone and dehydration. Recommend increase fluid intake. Recommend reduce Chlorthalidone to half tablet daily with repeat BMP in 1 week.

## 2021-12-28 NOTE — Telephone Encounter (Addendum)
Left message for patient to call back     ----- Message from Loel Dubonnet, NP sent at 12/27/2021  5:00 PM EST ----- Normal liver, electrolytes.  LDL at goal of <70.  Kidney function decreased from previous. Likely due to chlorthalidone and dehydration. Recommend increase fluid intake. Recommend reduce Chlorthalidone to half tablet daily with repeat BMP in 1 week.

## 2022-01-01 ENCOUNTER — Encounter (HOSPITAL_BASED_OUTPATIENT_CLINIC_OR_DEPARTMENT_OTHER): Payer: Self-pay

## 2022-01-10 ENCOUNTER — Ambulatory Visit: Payer: Medicare Other | Admitting: Nurse Practitioner

## 2022-01-10 DIAGNOSIS — R42 Dizziness and giddiness: Secondary | ICD-10-CM | POA: Diagnosis not present

## 2022-01-10 DIAGNOSIS — I10 Essential (primary) hypertension: Secondary | ICD-10-CM | POA: Diagnosis not present

## 2022-01-10 DIAGNOSIS — R5383 Other fatigue: Secondary | ICD-10-CM | POA: Diagnosis not present

## 2022-01-11 LAB — BASIC METABOLIC PANEL
BUN/Creatinine Ratio: 13 (ref 10–24)
BUN: 15 mg/dL (ref 8–27)
CO2: 25 mmol/L (ref 20–29)
Calcium: 9.3 mg/dL (ref 8.6–10.2)
Chloride: 97 mmol/L (ref 96–106)
Creatinine, Ser: 1.15 mg/dL (ref 0.76–1.27)
Glucose: 133 mg/dL — ABNORMAL HIGH (ref 70–99)
Potassium: 4 mmol/L (ref 3.5–5.2)
Sodium: 135 mmol/L (ref 134–144)
eGFR: 63 mL/min/{1.73_m2} (ref >59)

## 2022-01-15 ENCOUNTER — Ambulatory Visit (INDEPENDENT_AMBULATORY_CARE_PROVIDER_SITE_OTHER): Payer: Medicare Other

## 2022-01-15 ENCOUNTER — Telehealth (HOSPITAL_BASED_OUTPATIENT_CLINIC_OR_DEPARTMENT_OTHER): Payer: Self-pay

## 2022-01-15 DIAGNOSIS — R42 Dizziness and giddiness: Secondary | ICD-10-CM | POA: Diagnosis not present

## 2022-01-15 DIAGNOSIS — R5383 Other fatigue: Secondary | ICD-10-CM

## 2022-01-15 DIAGNOSIS — R000 Tachycardia, unspecified: Secondary | ICD-10-CM

## 2022-01-15 DIAGNOSIS — I10 Essential (primary) hypertension: Secondary | ICD-10-CM

## 2022-01-15 NOTE — Telephone Encounter (Signed)
Patient in the office today for Echocardiogram. He brought his blood pressure cuff from home to be checked. Office cuff showed 151/84 and home cuff showed 148/84. Patient also brought his blood pressure log from home for review. He notes that he wrote symptoms on his sheet that he has been feeling. Will give blood pressure log for review to Laurann Montana, NP

## 2022-01-16 ENCOUNTER — Other Ambulatory Visit (HOSPITAL_BASED_OUTPATIENT_CLINIC_OR_DEPARTMENT_OTHER): Payer: Self-pay

## 2022-01-16 DIAGNOSIS — R000 Tachycardia, unspecified: Secondary | ICD-10-CM

## 2022-01-16 DIAGNOSIS — R5383 Other fatigue: Secondary | ICD-10-CM

## 2022-01-16 DIAGNOSIS — R42 Dizziness and giddiness: Secondary | ICD-10-CM

## 2022-01-16 DIAGNOSIS — I10 Essential (primary) hypertension: Secondary | ICD-10-CM

## 2022-01-16 LAB — ECHOCARDIOGRAM COMPLETE
AR max vel: 2.42 cm2
AV Area VTI: 2.36 cm2
AV Area mean vel: 2.11 cm2
AV Mean grad: 4 mmHg
AV Peak grad: 8.3 mmHg
Ao pk vel: 1.44 m/s
Area-P 1/2: 3.08 cm2
Calc EF: 76.6 %
S' Lateral: 2.25 cm
Single Plane A2C EF: 74.5 %
Single Plane A4C EF: 78.3 %

## 2022-01-16 NOTE — Telephone Encounter (Signed)
Home BP cuff found to be accurate. Plan as detailed on echocardiogram result copied below:  "Echo with normal heart pumping function. Heart muscle moderately stiff and thick. We prevent this from worsening by keeping blood pressure well controlled. Mild leaking of mitral valve which is not significant enough to cause dizziness. Mild dilation of aortic root 71m.   Recommend repeat echo in 1 year for monitoring.  No significant abnormalities by echo or carotid that would cause lightheadedness.  Continue current BP medications. No hypotension that would cause lightheadedness.  Only other potential cause of lightheadedness from a cardiac perspective would be arrhythmia, we can discuss possible ZIO monitor at upcoming follow up. "  Pt made aware of recommendations - see echo report for details.   CLoel Dubonnet NP

## 2022-01-18 ENCOUNTER — Ambulatory Visit (HOSPITAL_BASED_OUTPATIENT_CLINIC_OR_DEPARTMENT_OTHER): Payer: Medicare Other | Admitting: Family

## 2022-01-18 ENCOUNTER — Encounter (HOSPITAL_BASED_OUTPATIENT_CLINIC_OR_DEPARTMENT_OTHER): Payer: Self-pay | Admitting: Family

## 2022-01-18 VITALS — BP 122/62 | HR 70 | Ht 72.0 in | Wt 198.0 lb

## 2022-01-18 DIAGNOSIS — Z86718 Personal history of other venous thrombosis and embolism: Secondary | ICD-10-CM

## 2022-01-18 DIAGNOSIS — R5383 Other fatigue: Secondary | ICD-10-CM

## 2022-01-18 DIAGNOSIS — R42 Dizziness and giddiness: Secondary | ICD-10-CM | POA: Diagnosis not present

## 2022-01-18 DIAGNOSIS — I10 Essential (primary) hypertension: Secondary | ICD-10-CM | POA: Diagnosis not present

## 2022-01-18 DIAGNOSIS — R0683 Snoring: Secondary | ICD-10-CM

## 2022-01-18 DIAGNOSIS — D6859 Other primary thrombophilia: Secondary | ICD-10-CM

## 2022-01-18 DIAGNOSIS — G4730 Sleep apnea, unspecified: Secondary | ICD-10-CM

## 2022-01-18 DIAGNOSIS — H538 Other visual disturbances: Secondary | ICD-10-CM

## 2022-01-18 NOTE — Progress Notes (Signed)
Office Visit    Patient Name: Pedro Kent Date of Encounter: 01/18/2022  PCP:  Janie Morning, [facility]  Cardiologist:  Shelva Majestic, MD  Advanced Practice Provider:  No care team member to display Electrophysiologist:  None      Chief Complaint    Pedro Kent is a 81 y.o. male presents today for tachycardia, lightheadedness, fatigue.  Past Medical History    Past Medical History:  Diagnosis Date   Adrenal hyperplasia ([place])    Cancer ([place]) 2010   prostate cancer   DVT (deep venous thrombosis) ([place])    Hyperlipidemia    Hypertension    Past Surgical History:  Procedure Laterality Date   2-D echocardiogram  03/10/2009   Normal left ventricular systolic function. Mild MR. Normal RV systolic pressure. Mild pulmonic valvular regurgitation.   Cardiac stress test  04/05/2010   Negative for ischemia    Allergies  Allergies  Allergen Reactions   Ace Inhibitors Other (See Comments)    Unknown    History of Present Illness    Pedro Kent is a 81 y.o. male with a hx of hypertension, hyperlipidemia, DVT, prostate cancer s/p radiation C, adrenal hyperplasia last seen 12/26/21.  Seen 11/09/2021 with a 2-year history of fatigue.  Patient attributed to Effexor.  He was offered 1 week trial of reducing metoprolol.  CBC no anemia, CMP normal electrolytes and liver, A1c prediabetic, TSH normal.  He did not notice any change with reduced dose of metoprolol.  Seen in clinic 12/13/2021 and started on chlorthalidone due to elevated blood pressure.  Recommended for follow-up labs in 2 weeks. He called the office yesterday noting heart rate of 105 and 110 associated with cloudy vision, tired, feeling washed out as well as dizziness.  Blood pressure at home 129/74-135/75.  At follow up 12/26/21 noted similar complaints. Based on renal function Chlorthalidone reduced to half tablet. Echo performed 01/15/22 normal LVEF60-65%, no RWMA, moderate LVH,  gr2DD, mild MR, aortic root dilation 19m. Carotid duplex 01/15/22 bilateral 1-39% stenosis.   He presents today for follow-up with his niece. 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 He notes that he is still tired, experiencing cloudy vision, fatigue. Now notes intermittent nonproductive cough that has been ongoing for some time. He remains concerned that his medications are causing his symptoms. We reviewed that as his BP is at goal and not too low, low suspicion medications causing symptoms particularly as they have been longstanding medications. We reviewed his echo, carotid duplex, and home BP log which is routinely in the 130s. His BP cuff has been previously checked for accuracy.   Reports no shortness of breath and stable dyspnea on exertion. Reports no chest pain, pressure, or tightness. No edema, orthopnea, PND. Reports no palpitations.      EKGs/Labs/Other Studies Reviewed:   The following studies were reviewed today:  Echo 01/15/22  1. Left ventricular ejection fraction, by estimation, is 60 to 65%. The  left ventricle has normal function. The left ventricle has no regional  wall motion abnormalities. There is moderate asymmetric left ventricular  hypertrophy of the basal-septal  segment. Left ventricular diastolic parameters are consistent with Grade  II diastolic dysfunction (pseudonormalization). Elevated left atrial  pressure.   2. Right ventricular systolic function is normal. The right ventricular  size is normal.   3. Left atrial size was moderately dilated.   4. The mitral valve is abnormal. Mild mitral valve regurgitation.  No  evidence of mitral stenosis. Severe mitral annular calcification.   5. The aortic valve is tricuspid. Aortic valve regurgitation is not  visualized. Aortic valve sclerosis/calcification is present, without any  evidence of aortic stenosis.   6. Aortic dilatation noted. There is dilatation of the aortic root,  measuring 41 mm.  Carotid  duplex 01/15/22 Right Carotid: Velocities in the right ICA are consistent with a 1-39%  stenosis.   Left Carotid: Velocities in the left ICA are consistent with a 1-39%  stenosis.   Vertebrals: Bilateral vertebral arteries demonstrate antegrade flow.  Subclavians: Normal flow hemodynamics were seen in bilateral subclavian               arteries.  EKG:  EKG is not ordered today.   Recent Labs: 11/09/2021: Hemoglobin 14.8; Platelets 172; TSH 1.040 12/26/2021: ALT 28; Magnesium 1.9 01/10/2022: BUN 15; Creatinine, Ser 1.15; Potassium 4.0; Sodium 135  Recent Lipid Panel    Component Value Date/Time   CHOL 118 02/09/2020 1237   TRIG 51 02/09/2020 1237   HDL 50 02/09/2020 1237   CHOLHDL 2.4 02/09/2020 1237   CHOLHDL 2.9 05/16/2016 1127   VLDL 14 05/16/2016 1127   LDLCALC 56 02/09/2020 1237   LDLDIRECT 55 12/26/2021 1448    Home Medications   Current Meds  Medication Sig   amLODipine (NORVASC) 10 MG tablet TAKE 1 TABLET (10 MG TOTAL) BY MOUTH DAILY. NEED OV.   atorvastatin (LIPITOR) 10 MG tablet Take 1 tablet (10 mg total) by mouth daily. Please have patient give our office a call for yearly appt.   chlorthalidone (HYGROTON) 25 MG tablet Take 0.5 tablets (12.5 mg total) by mouth daily.   ELIQUIS 5 MG TABS tablet TAKE 1 TABLET BY MOUTH TWICE A DAY   Green Tea, Camillia sinensis, (GREEN TEA PO) Take by mouth. Cup of Tea daily.   MELATONIN GUMMIES PO Take 5 mg by mouth daily.   metoprolol tartrate (LOPRESSOR) 50 MG tablet TAKE 25 MG (0.5 TABLET) IN THE MORNING, AND 50 MG (1 TABLET) IN THE EVENING   Misc Natural Products (MENS PROSTATE HEALTH FORMULA PO) Take 4 capsules by mouth daily.   Multiple Vitamins-Minerals (MULTIVITAMIN) tablet Take 1 tablet by mouth daily.   Nutritional Supplements (ENSURE COMPLETE PO) Take by mouth daily.   omeprazole (PRILOSEC) 20 MG capsule Take 20 mg by mouth daily as needed (heartburn).   tamsulosin (FLOMAX) 0.4 MG CAPS capsule Take 1 capsule (0.4 mg  total) by mouth 2 (two) times daily.   valsartan (DIOVAN) 320 MG tablet TAKE HALF (160 MG) IN THE MORNING AND THE OTHER HALF (160 MG) IN THE EVENING    Review of Systems      All other systems reviewed and are otherwise negative except as noted above.  Physical Exam    VS:  BP 122/62   Pulse 70   Ht 6' (1.829 m)   Wt 198 lb (89.8 kg)   BMI 26.85 kg/m  , BMI Body mass index is 26.85 kg/m.  Wt Readings from Last 3 Encounters:  01/18/22 198 lb (89.8 kg)  12/26/21 192 lb 14.4 oz (87.5 kg)  12/13/21 193 lb 9.6 oz (87.8 kg)     GEN: Well nourished, overweight, well developed, in no acute distress. HEENT: normal. Neck: Supple, no JVD, carotid bruits, or masses. Cardiac: RRR, no murmurs, rubs, or gallops. No clubbing, cyanosis, edema.  Radials/PT 2+ and equal bilaterally.  Respiratory:  Respirations regular and unlabored, clear to auscultation bilaterally. GI: Soft,  nontender, nondistended. MS: No deformity or atrophy. Skin: Warm and dry, no rash. Neuro:  Strength and sensation are intact. Psych: Normal affect.  Assessment & Plan    Hypertension- BP well controlled. Continue Valsartan, Amlodipine. Stop Metoprolol, as below.  Fatigue/Lightheadedness/Intermittent blurred vision - Has been on-going issues for over x1 year.  Reiterated need to f/u Ophthalmologist regarding vision - does not wear glasses presently prescribed.   TSH, CMP, mag WNL. Echo 12/2021 normal LVEF, mild MR. Carotid duplex 12/2021 bilateral 1-39% stenosis. Reiterated no cardiac abnormalities that would cause symptoms and needs to follow up with PCP for further workup. He remains unconvinced his medications are not due to his medications. Agreed to trial off Metoprolol with check in of BP in 2 weeks via MyChart to ensure still at goal.  HLD - Denies myalgias. 12/2021 LDL 55. Continue Atorvastatin '10mg'$ .   Hx of DVT / Hypercoagulable state - On Eliquis. Denies bleeding complications. Continue Eliquis '5mg'$ .    Snoring / Sleep Disordered Breathing: STOP-BANG: 6. Endorses morning fatigue, snoring. Home sleep study previously ordered. Not yet completed. Reiterated that this may be cause of his fatigue.    Disposition: Follow up in 6 month(s) with Shelva Majestic, MD or APP.  Signed, Loel Dubonnet, NP 01/18/2022, 2:33 PM [place]

## 2022-01-18 NOTE — Patient Instructions (Addendum)
Medication Instructions:  Your physician has recommended you make the following change in your medication:    STOP Metoprolol  *If you need a refill on your cardiac medications before your next appointment, please call your pharmacy*   Follow-Up: At [facility], you and your health needs are our priority.  As part of our continuing mission to provide you with exceptional heart care, we have created designated Provider Care Teams.  These Care Teams include your primary Cardiologist (physician) and Advanced Practice Providers (APPs -  Physician Assistants and Nurse Practitioners) who all work together to provide you with the care you need, when you need it.  We recommend signing up for the patient portal called "MyChart".  Sign up information is provided on this After Visit Summary.  MyChart is used to connect with patients for Virtual Visits (Telemedicine).  Patients are able to view lab/test results, encounter notes, upcoming appointments, etc.  Non-urgent messages can be sent to your provider as well.   To learn more about what you can do with MyChart, go to [email or web address].    Your next appointment:   6 month(s)  The format for your next appointment:   In Person  Provider:   Shelva Majestic, MD     Other Instructions  Please establish with primary care.

## 2022-02-01 ENCOUNTER — Other Ambulatory Visit: Payer: Self-pay | Admitting: Cardiovascular Disease

## 2022-02-01 ENCOUNTER — Telehealth (HOSPITAL_BASED_OUTPATIENT_CLINIC_OR_DEPARTMENT_OTHER): Payer: Self-pay

## 2022-02-01 NOTE — Telephone Encounter (Addendum)
Left message for patient to call back     ----- Message from Gerald Stabs, RN sent at 01/18/2022  2:57 PM EST ----- Call for BP check in

## 2022-02-01 NOTE — Telephone Encounter (Signed)
Eliquis refill: 81 yrs old, wt 89.8kg, Crea-1.15 on 01/10/2022, Dx-Hx of DVT/Hypercoagulable state, and last seen by Laurann Montana, NP on 01/18/2022. Refill already addressed.

## 2022-02-02 ENCOUNTER — Telehealth: Payer: Self-pay | Admitting: Cardiovascular Disease

## 2022-02-02 MED ORDER — METOPROLOL SUCCINATE ER 25 MG PO TB24: 12.5000 mg | ORAL_TABLET | Freq: Every day | ORAL | 3 refills | Status: DC

## 2022-02-02 NOTE — Telephone Encounter (Signed)
BP at goal. Heart rate elevated since discontinuation of Metoprolol Tartrate. Recommend start Metoprolol Succinate 12.'5mg'$  daily in the evening.   Per our previous discussion, no cardiac abnormalities that would cause symptoms and needs to follow up with PCP for further workup of fatigue.  Loel Dubonnet, NP

## 2022-02-02 NOTE — Telephone Encounter (Signed)
Patient calling with BP readings:  14th 6:30 pm  125/66 HR 105, 112/71 HR 101 14th 11:55 am 127/79 HR 99, 139/76 HR 104 13th 12:10 pm 113/71 (only one BP reading) HR 105, 98 13th 7:30 pm 122/66 HR 98, 127/77 HR 98 12th 12pm 121/76 HR 115, 107/69 HR 114 Says same problems energy low and tired.

## 2022-02-02 NOTE — Telephone Encounter (Signed)
Patient called in his blood pressures, they are in separate encounter. Will route encounter to Laurann Montana, NP for review.

## 2022-02-02 NOTE — Telephone Encounter (Signed)
Returned call to patient and provided the following recommendations. Rx to pharmacy.    "BP at goal. Heart rate elevated since discontinuation of Metoprolol Tartrate. Recommend start Metoprolol Succinate 12.'5mg'$  daily in the evening.    Per our previous discussion, no cardiac abnormalities that would cause symptoms and needs to follow up with PCP for further workup of fatigue.   Loel Dubonnet, NP"

## 2022-03-07 ENCOUNTER — Other Ambulatory Visit (HOSPITAL_BASED_OUTPATIENT_CLINIC_OR_DEPARTMENT_OTHER): Payer: Self-pay | Admitting: Family

## 2022-03-07 DIAGNOSIS — I779 Disorder of arteries and arterioles, unspecified: Secondary | ICD-10-CM

## 2022-04-07 ENCOUNTER — Other Ambulatory Visit: Payer: Self-pay | Admitting: Cardiovascular Disease

## 2022-04-22 ENCOUNTER — Other Ambulatory Visit: Payer: Self-pay | Admitting: Cardiovascular Disease

## 2022-06-25 ENCOUNTER — Encounter: Payer: Self-pay | Admitting: Cardiovascular Disease

## 2022-06-25 ENCOUNTER — Ambulatory Visit: Payer: Medicare Other | Attending: Cardiovascular Disease | Admitting: Cardiovascular Disease

## 2022-06-25 VITALS — BP 128/76 | HR 90 | Ht 72.0 in | Wt 203.0 lb

## 2022-06-25 DIAGNOSIS — E785 Hyperlipidemia, unspecified: Secondary | ICD-10-CM | POA: Diagnosis not present

## 2022-06-25 DIAGNOSIS — E782 Mixed hyperlipidemia: Secondary | ICD-10-CM

## 2022-06-25 DIAGNOSIS — Z86718 Personal history of other venous thrombosis and embolism: Secondary | ICD-10-CM

## 2022-06-25 DIAGNOSIS — R5383 Other fatigue: Secondary | ICD-10-CM

## 2022-06-25 DIAGNOSIS — I10 Essential (primary) hypertension: Secondary | ICD-10-CM | POA: Diagnosis not present

## 2022-06-25 DIAGNOSIS — D6859 Other primary thrombophilia: Secondary | ICD-10-CM

## 2022-06-25 MED ORDER — METOPROLOL SUCCINATE ER 25 MG PO TB24: 25.0000 mg | ORAL_TABLET | Freq: Two times a day (BID) | ORAL | 3 refills | Status: DC

## 2022-06-25 NOTE — Progress Notes (Signed)
Patient ID: Pedro Kent, male   DOB: 04-Apr-1940, 82 y.o.   MRN: [id number]   2  Primary MD: Dr. Juline Patch  HPI: Pedro Kent is a 82 y.o. male who presents for a 23 month follow-up cardiology evaluation.  Pedro Kent developed a  deep vein thrombosis of his left lower extremity in January 2007and has been on chronic anticoagulation therapy with Coumadin ever since. Additional problems include hypertension with adrenal hyperplasia, history of prostate CA treated with history of radiation seeds,  hyperlipidemia, and an abnormal ECG with T wave inversion. An echo Doppler study in January 2011  showed mild concentric LVH, mild MR with possible torn redundant chordae tendinea, mild aortic valve sclerosis, and mild coronary insufficiency.   On 09/23/2012 an echo Doppler study showed an ejection fraction at 60-65%. He had mild left ventricular hypertrophy and had normal diastolic parameters on this present study. There is very minimal pulmonary pressure elevation at 31 mm. Is mitral valve is mildly thickened with no significant regurgitation.  When I saw him he was on a multiple drug drug regimen for hypertension, consisting of amlodipine 10 mg, metoprolol 50 mg in the am and 25 mg in the pm, spironolactone 25 mg daily and valsartan 320 mg.   his blood pressure was elevated and I recommended that he increase spironolactone and take 25 mg in the morning and 12.5 mg at night.  Ifblood pressure continued to be elevated, then he will increase this to 25 mg twice a day.  Over the past several months, he states his blood pressure has improved.  He will be establishing new primary care with Dr. Selena Kent.  He has seen Dr. Vernie Kent for his prostate CA.  He continues to take Prilosec for GERD but this has been stable and he takes rarely.  He is taking atorvastatin 10 mg for hyperlipidemia.  He is on chronic warfarin therapy with his clot history.  He denies recent bleeding.  He continues to smoke several small  cigars per day.  He denies any chest pain, PND, orthopnea.   I saw him in March 2020 and since his prior evaluation in September 2018 he had some issues with vertigo.  He  was evaluated in the emergency room was not felt to have a stroke.  He had an MRI that was negative for an acute stroke.  Was able to walk without difficulty.  Due to symptoms from his peripheral vertigo he was instructed on the Epley maneuver.  He was also reevaluated in the emergency room setting for depression.  He has seen Dr. Thomasena Kent in Dr. Elmyra Kent absence and he has had some medication adjustment eluding slight reduction in his beta-blocker therapy.  He now has been taking amlodipine 10 mg, metoprolol 25 mg twice a day, and has been taking spironolactone 37-1/2 mg in the morning in addition to valsartan 320 mg daily.  He is no longer on warfarin but is now on Eliquis for anticoagulation.  He continues to be on atorvastatin for hyperlipidemia.  He has history of prostate CA which she is seen Dr. Vernie Kent.  He continued to be on Flonase.  His GERD was controlled with Prilosec.  I last saw him on August 03, 2020.  Since his prior evaluation he denies any chest pain, shortness of breath, or palpitations.  He has experienced some fatigue.  He was recently started on some Zyrtec for head congestion.  His blood pressure has been stable at home.  He denies any significant leg  swelling.  He continues to be on atorvastatin 10 mg, metoprolol tartrate 25 mg in the morning and 50 mg in the evening, valsartan 320 mg daily.  He was no longer on spironolactone.  He is on Eliquis for anticoagulation 5 mg twice daily.  He is now followed by Dr. Irena Kent since Dr. Selena Kent has retired.    He was last evaluated by Pedro Shields, NP on January 18, 2022.  Prior to that evaluation, he had been evaluated in clinic with fatigue and his dose of metoprolol had been reduced.  Subsequently on evaluation in October 2023 he was started on chlorthalidone due to elevated  blood pressure.  An echo Doppler study on January 15, 2022 showed EF at 60 to 65% without wall motion abnormalities.  There was moderate LVH, grade 2 diastolic dysfunction, mild MR, and mild dilatation of aortic root at 41 mm.  On carotid duplex imaging on January 15, 2022 had bilateral 1 to 39% stenoses.  Presently, he denies any chest pain.  He is on a medical regimen for hypertension with amlodipine 10 mg, metoprolol succinate 25 mg, and valsartan 320 mg daily but takes 160 mg in the morning and 160 mg in the evening.  He also has been on atorvastatin 10 mg.  At times he has experienced some dizziness and fatigue.  He was concerned if he needed to take these medications.  He denies any chest tightness or pressure.  He presents for evaluation.  Past Medical History:  Diagnosis Date   Adrenal hyperplasia (HCC)    Cancer (HCC) 2010   prostate cancer   DVT (deep venous thrombosis) (HCC)    Hyperlipidemia    Hypertension     Past Surgical History:  Procedure Laterality Date   2-D echocardiogram  03/10/2009   Normal left ventricular systolic function. Mild MR. Normal RV systolic pressure. Mild pulmonic valvular regurgitation.   Cardiac stress test  04/05/2010   Negative for ischemia    Allergies  Allergen Reactions   Ace Inhibitors Other (See Comments)    Unknown    Current Outpatient Medications  Medication Sig Dispense Refill   amLODipine (NORVASC) 10 MG tablet TAKE 1 TABLET (10 MG TOTAL) BY MOUTH DAILY. NEED OV. 90 tablet 3   atorvastatin (LIPITOR) 10 MG tablet TAKE 1 TABLET DAILY. PLEASE HAVE PATIENT GIVE OUR OFFICE A CALL FOR YEARLY APPT. 90 tablet 3   chlorthalidone (HYGROTON) 25 MG tablet Take 0.5 tablets (12.5 mg total) by mouth daily. 45 tablet 3   ELIQUIS 5 MG TABS tablet TAKE 1 TABLET BY MOUTH TWICE A DAY 180 tablet 1   MELATONIN GUMMIES PO Take 5 mg by mouth daily.     Misc Natural Products (MENS PROSTATE HEALTH FORMULA PO) Take 4 capsules by mouth daily.     Multiple  Vitamins-Minerals (MULTIVITAMIN) tablet Take 1 tablet by mouth daily.     Nutritional Supplements (ENSURE COMPLETE PO) Take by mouth daily.     tamsulosin (FLOMAX) 0.4 MG CAPS capsule Take 1 capsule (0.4 mg total) by mouth 2 (two) times daily. 90 capsule 0   valsartan (DIOVAN) 320 MG tablet TAKE HALF (160 MG) IN THE MORNING AND THE OTHER HALF (160 MG) IN THE EVENING 90 tablet 1   Green Tea, Camillia sinensis, (GREEN TEA PO) Take by mouth. Cup of Tea daily. (Patient not taking: Reported on 06/25/2022)     metoprolol succinate (TOPROL-XL) 25 MG 24 hr tablet Take 1 tablet (25 mg total) by mouth in the morning  and at bedtime. Take with or immediately following a meal. 180 tablet 3   omeprazole (PRILOSEC) 20 MG capsule Take 20 mg by mouth daily as needed (heartburn). (Patient not taking: Reported on 06/25/2022)     No current facility-administered medications for this visit.    Social History   Socioeconomic History   Marital status: Divorced    Spouse name: Not on file   Number of children: Not on file   Years of education: Not on file   Highest education level: Not on file  Occupational History   Not on file  Tobacco Use   Smoking status: Every Day    Packs/day: .25    Types: Cigars, Cigarettes   Smokeless tobacco: Never  Substance and Sexual Activity   Alcohol use: Yes    Comment: rarely   Drug use: Not on file   Sexual activity: Not on file  Other Topics Concern   Not on file  Social History Narrative   Not on file   Social Determinants of Health   Financial Resource Strain: Not on file  Food Insecurity: Not on file  Transportation Needs: Not on file  Physical Activity: Not on file  Stress: Not on file  Social Connections: Not on file  Intimate Partner Violence: Not on file   Socially he has 2 children one grandchild. He does walk. He denies recent alcohol use.  No family history on file.   ROS General: Negative; No fevers, chills, or night sweats; fatigue HEENT:  Negative; No changes in vision or hearing, sinus congestion, difficulty swallowing Pulmonary: Negative; No cough, wheezing, shortness of breath, hemoptysis Cardiovascular: See history of present illness GI: positive for GERD on omeprazole No nausea, vomiting, diarrhea, or abdominal pain GU: History of prostate CA Musculoskeletal: Negative; no myalgias, joint pain, or weakness Hematologic/Oncology: Negative; no easy bruising, bleeding Endocrine: Negative; no heat/cold intolerance; no diabetes Neuro:vertigo Skin: Negative; No rashes or skin lesions Psychiatric: Pression Sleep: Negative; No snoring, daytime sleepiness, hypersomnolence, bruxism, restless legs, hypnogognic hallucinations, no cataplexy Other comprehensive 14 point system review is negative.    PE BP 128/76   Pulse 90   Ht 6' (1.829 m)   Wt 203 lb (92.1 kg)   SpO2 98%   BMI 27.53 kg/m     Repeat blood pressure by me was 128/66  Wt Readings from Last 3 Encounters:  06/25/22 203 lb (92.1 kg)  01/18/22 198 lb (89.8 kg)  12/26/21 192 lb 14.4 oz (87.5 kg)   General: Alert, oriented, no distress.  Skin: normal turgor, no rashes, warm and dry HEENT: Normocephalic, atraumatic. Pupils equal round and reactive to light; sclera anicteric; extraocular muscles intact;  Nose without nasal septal hypertrophy Mouth/Parynx benign; Mallinpatti scale 3 Neck: No JVD, no carotid bruits; normal carotid upstroke Lungs: clear to ausculatation and percussion; no wheezing or rales Chest wall: without tenderness to palpitation Heart: PMI not displaced, RRR, s1 s2 normal, 1/6 systolic murmur, no diastolic murmur, no rubs, gallops, thrills, or heaves Abdomen: soft, nontender; no hepatosplenomehaly, BS+; abdominal aorta nontender and not dilated by palpation. Back: no CVA tenderness Pulses 2+ Musculoskeletal: full range of motion, normal strength, no joint deformities Extremities: no clubbing cyanosis or edema, Homan's sign negative   Neurologic: grossly nonfocal; Cranial nerves grossly wnl Psychologic: Normal mood and affect   Jun 25, 2022 ECG (independently read by me): NSR at 90, NSSTT changes  June 15, 20222 ECG (independently read by me): NSR at 74, sinus arrhythmia; PRWP V1-23 April 2018  ECG (independently read by me): Sinus rhythm at 95 bpm with occasional PAC.  Poor anterior R wave progression.  Lateral T wave abnormality.  September 2018 ECG (independently read by me): Sinus tachycardia 59 bpm.  Poor progression V1 through V3.  Normal intervals.  No ectopy.  March 2018 ECG (independently read by me): Normal sinus rhythm at 61 bpm with mild sinus arrhythmia.  Poor R wave progression.  Previously noted inferolateral T changes.  May 2017 ECG (independently read by me): Normal sinus rhythm with occasional PACs.  Previously noted inferolateral T wave abnormality.  QTc interval 410 ms.  December 2016 ECG (independently read by me):  Normal sinus rhythm with an occasional PAC. Previously noted inferolateral T wave inversion  November 2015 ECG (independently read by me): Normal sinus rhythm at 62 bpm.  Poor R-wave progression previously noted T-wave inversion inferolaterally  Prior September 2014 ECG: Sinus rhythm at 57 beats a minute. Previously noted T-wave abnormalities inferolaterally.  LABS:    Latest Ref Rng & Units 06/25/2022    2:18 PM 01/10/2022    1:15 PM 12/26/2021    2:48 PM  BMP  Glucose 70 - 99 mg/dL 86  409  811   BUN 8 - 27 mg/dL 19  15  31    Creatinine 0.76 - 1.27 mg/dL 9.14  7.82  9.56   BUN/Creat Ratio 10 - 24 16  13  22    Sodium 134 - 144 mmol/L 134  135  140   Potassium 3.5 - 5.2 mmol/L 4.9  4.0  4.4   Chloride 96 - 106 mmol/L 95  97  98   CO2 20 - 29 mmol/L 26  25  25    Calcium 8.6 - 10.2 mg/dL 21.3  9.3  9.7       Latest Ref Rng & Units 06/25/2022    2:18 PM 12/26/2021    2:48 PM 11/09/2021    3:20 PM  Hepatic Function  Total Protein 6.0 - 8.5 g/dL 7.3  7.1  7.1   Albumin 3.7 - 4.7  g/dL 4.5  4.5  4.5   AST 0 - 40 IU/L 32  24  20   ALT 0 - 44 IU/L 34  28  27   Alk Phosphatase 44 - 121 IU/L 86  96  94   Total Bilirubin 0.0 - 1.2 mg/dL 0.7  0.6  0.6       Latest Ref Rng & Units 06/25/2022    2:18 PM 11/09/2021    3:20 PM 02/09/2020   12:37 PM  CBC  WBC 3.4 - 10.8 x10E3/uL 5.6  6.1  4.8   Hemoglobin 13.0 - 17.7 g/dL 08.6  57.8  46.9   Hematocrit 37.5 - 51.0 % 42.3  42.8  40.2   Platelets 150 - 450 x10E3/uL 210  172  187    Lab Results  Component Value Date   MCV 100 (H) 06/25/2022   MCV 101 (H) 11/09/2021   MCV 102 (H) 02/09/2020   Lab Results  Component Value Date   TSH 0.772 06/25/2022   Lab Results  Component Value Date   HGBA1C 5.8 (H) 11/09/2021     Lipid Panel     Component Value Date/Time   CHOL 133 06/25/2022 1418   TRIG 53 06/25/2022 1418   HDL 61 06/25/2022 1418   CHOLHDL 2.2 06/25/2022 1418   CHOLHDL 2.9 05/16/2016 1127   VLDL 14 05/16/2016 1127   LDLCALC 60 06/25/2022 1418   LDLDIRECT 55 12/26/2021  1448   IMPRESSION:  1. Essential hypertension   2. Fatigue, unspecified type   3. Hyperlipidemia LDL goal <70   4. History of DVT (deep vein thrombosis)   5. Hypercoagulable state (HCC)   6. Mixed hyperlipidemia     ASSESSMENT AND PLAN: Pedro Kent is an 82 year-old African-American gentleman who is 17years s/p developing his deep vein thrombosis in January 2007.  He initially was on chronic Coumadin therapy which subsequently was switched to Eliquis  5 mg twice a day.  He has had significant issues with hypertension and had required 4 drug regimen.  Most recently, he is on amlodipine 10 mg, chlorthalidone 12.5 mg, metoprolol succinate 25 mg in addition to valsartan which he takes 160 mg twice a day.  Remotely, he had been on higher dose metoprolol with 25 mg in the morning and 50 mg at night but due to to fatigability this had been reduced.  He was concerned today if he needed to take his medications.  He was on low-dose atorvastatin was  concerned of continuing to take statin therapy.  He also wondered if he needed to continue to take the high-dose valsartan.  Presently, his blood pressure is stable with his current regimen.  His heart rate has increased to a resting heart rate in the 90s with increased heart rate with activity.  If he is only taking metoprolol 25 mg daily I have suggested perhaps trying to take this twice a day if he can tolerate.  I have recommended follow-up laboratory with a comprehensive metabolic panel, CBC, TSH and lipid studies.  I have recommended that he see Anice Paganini, NP in 4 to 6 weeks for reevaluation and I will see him in 3 to 6 months for follow-up evaluation.   Lennette Bihari, MD, [facility]  07/02/2022 10:28 AM

## 2022-06-25 NOTE — Patient Instructions (Addendum)
Medication Instructions:  Take Metoprolol 1 tablet (25 mg) twice daily   *If you need a refill on your cardiac medications before your next appointment, please call your pharmacy*   Lab Work: CMET, CBC, TSH, LIPID today   If you have labs (blood work) drawn today and your tests are completely normal, you will receive your results only by: MyChart Message (if you have MyChart) OR A paper copy in the mail If you have any lab test that is abnormal or we need to change your treatment, we will call you to review the results.   Follow-Up: At [facility], you and your health needs are our priority.  As part of our continuing mission to provide you with exceptional heart care, we have created designated Provider Care Teams.  These Care Teams include your primary Cardiologist (physician) and Advanced Practice Providers (APPs -  Physician Assistants and Nurse Practitioners) who all work together to provide you with the care you need, when you need it.  We recommend signing up for the patient portal called "MyChart".  Sign up information is provided on this After Visit Summary.  MyChart is used to connect with patients for Virtual Visits (Telemedicine).  Patients are able to view lab/test results, encounter notes, upcoming appointments, etc.  Non-urgent messages can be sent to your provider as well.   To learn more about what you can do with MyChart, go to [email or web address].    Your next appointment:   4-6 week(s)  Provider:   Bernadene Person, NP

## 2022-06-26 LAB — COMPREHENSIVE METABOLIC PANEL WITH GFR
ALT: 34 IU/L (ref 0–44)
AST: 32 IU/L (ref 0–40)
Albumin/Globulin Ratio: 1.6 (ref 1.2–2.2)
Albumin: 4.5 g/dL (ref 3.7–4.7)
Alkaline Phosphatase: 86 IU/L (ref 44–121)
BUN/Creatinine Ratio: 16 (ref 10–24)
BUN: 19 mg/dL (ref 8–27)
Bilirubin Total: 0.7 mg/dL (ref 0.0–1.2)
CO2: 26 mmol/L (ref 20–29)
Calcium: 10 mg/dL (ref 8.6–10.2)
Chloride: 95 mmol/L — ABNORMAL LOW (ref 96–106)
Creatinine, Ser: 1.22 mg/dL (ref 0.76–1.27)
Globulin, Total: 2.8 g/dL (ref 1.5–4.5)
Glucose: 86 mg/dL (ref 70–99)
Potassium: 4.9 mmol/L (ref 3.5–5.2)
Sodium: 134 mmol/L (ref 134–144)
Total Protein: 7.3 g/dL (ref 6.0–8.5)
eGFR: 59 mL/min/1.73 — ABNORMAL LOW

## 2022-06-26 LAB — CBC
Hematocrit: 42.3 % (ref 37.5–51.0)
Hemoglobin: 14.9 g/dL (ref 13.0–17.7)
MCH: 35.1 pg — ABNORMAL HIGH (ref 26.6–33.0)
MCHC: 35.2 g/dL (ref 31.5–35.7)
MCV: 100 fL — ABNORMAL HIGH (ref 79–97)
Platelets: 210 x10E3/uL (ref 150–450)
RBC: 4.25 x10E6/uL (ref 4.14–5.80)
RDW: 11.9 % (ref 11.6–15.4)
WBC: 5.6 x10E3/uL (ref 3.4–10.8)

## 2022-06-26 LAB — LIPID PANEL
Chol/HDL Ratio: 2.2 ratio (ref 0.0–5.0)
Cholesterol, Total: 133 mg/dL (ref 100–199)
HDL: 61 mg/dL (ref >39)
LDL Chol Calc (NIH): 60 mg/dL (ref 0–99)
Triglycerides: 53 mg/dL (ref 0–149)
VLDL Cholesterol Cal: 12 mg/dL (ref 5–40)

## 2022-06-26 LAB — TSH: TSH: 0.772 u[IU]/mL (ref 0.450–4.500)

## 2022-07-01 DIAGNOSIS — I82502 Chronic embolism and thrombosis of unspecified deep veins of left lower extremity: Secondary | ICD-10-CM | POA: Diagnosis not present

## 2022-07-02 ENCOUNTER — Encounter: Payer: Self-pay | Admitting: Cardiovascular Disease

## 2022-08-01 ENCOUNTER — Ambulatory Visit: Payer: Medicare Other | Attending: Nurse Practitioner | Admitting: Nurse Practitioner

## 2022-08-01 ENCOUNTER — Encounter: Payer: Self-pay | Admitting: Nurse Practitioner

## 2022-08-01 VITALS — BP 132/74 | HR 83 | Ht 72.0 in | Wt 206.6 lb

## 2022-08-01 DIAGNOSIS — Z86718 Personal history of other venous thrombosis and embolism: Secondary | ICD-10-CM

## 2022-08-01 DIAGNOSIS — E785 Hyperlipidemia, unspecified: Secondary | ICD-10-CM

## 2022-08-01 DIAGNOSIS — I10 Essential (primary) hypertension: Secondary | ICD-10-CM | POA: Diagnosis not present

## 2022-08-01 DIAGNOSIS — R5383 Other fatigue: Secondary | ICD-10-CM

## 2022-08-01 DIAGNOSIS — R42 Dizziness and giddiness: Secondary | ICD-10-CM | POA: Diagnosis not present

## 2022-08-01 MED ORDER — OLMESARTAN MEDOXOMIL 40 MG PO TABS: 40.0000 mg | ORAL_TABLET | Freq: Every day | ORAL | 3 refills | Status: DC

## 2022-08-01 MED ORDER — AMLODIPINE BESYLATE 5 MG PO TABS: 5.0000 mg | ORAL_TABLET | Freq: Every day | ORAL | 3 refills | Status: DC

## 2022-08-01 NOTE — Progress Notes (Signed)
Office Visit    Patient Name: Pedro Kent Date of Encounter: 08/01/2022  Primary Care Provider:  Irena Reichmann, DO Primary Cardiologist:  Nicki Guadalajara, MD  Chief Complaint    82 year old male with a history of hypertension, hyperlipidemia, DVT, prostate cancer s/p radiation seed, and adrenal hyperplasia who presents for follow-up related to hypertension and fatigue.   Past Medical History    Past Medical History:  Diagnosis Date   Adrenal hyperplasia (HCC)    Cancer (HCC) 2010   prostate cancer   DVT (deep venous thrombosis) (HCC)    Hyperlipidemia    Hypertension    Past Surgical History:  Procedure Laterality Date   2-D echocardiogram  03/10/2009   Normal left ventricular systolic function. Mild MR. Normal RV systolic pressure. Mild pulmonic valvular regurgitation.   Cardiac stress test  04/05/2010   Negative for ischemia    Allergies  Allergies  Allergen Reactions   Ace Inhibitors Other (See Comments)    Unknown     Labs/Other Studies Reviewed    The following studies were reviewed today:  Cardiac Studies & Procedures     STRESS TESTS  NM MYOCAR MULTI W/SPECT W 12/16/2012   ECHOCARDIOGRAM  ECHOCARDIOGRAM COMPLETE 01/16/2022  Narrative ECHOCARDIOGRAM REPORT    Patient Name:   Pedro Kent Date of Exam: 01/15/2022 Medical Rec #:  [id number]       Height:       72.0 in Accession #:    [id number]      Weight:       192.9 lb Date of Birth:  02/19/1941       BSA:          2.098 m Patient Age:    81 years        BP:           151/84 mmHg Patient Gender: M               HR:           84 bpm. Exam Location:  Outpatient  Procedure: 2D Echo, 3D Echo, Cardiac Doppler and Color Doppler  Indications:    R53.83 Fatigue; R42 Lightheaded  History:        Patient has prior history of Echocardiogram examinations, most recent 09/23/2012. Signs/Symptoms:Dizziness/Lightheadedness; Risk Factors:Hypertension, Dyslipidemia and Current Smoker. Patient denies  chest pain, SOB or leg edema. Patient complains of overall fatigue, loss of stamina and lightheadedness with vertigo.  Sonographer:    Carlos American RVT, RDCS (AE), RDMS Referring Phys: [phone] [facility]   Sonographer Comments: Suboptimal apical window and suboptimal subcostal window. Image acquisition challenging due to respiratory motion. IMPRESSIONS   1. Left ventricular ejection fraction, by estimation, is 60 to 65%. The left ventricle has normal function. The left ventricle has no regional wall motion abnormalities. There is moderate asymmetric left ventricular hypertrophy of the basal-septal segment. Left ventricular diastolic parameters are consistent with Grade II diastolic dysfunction (pseudonormalization). Elevated left atrial pressure. 2. Right ventricular systolic function is normal. The right ventricular size is normal. 3. Left atrial size was moderately dilated. 4. The mitral valve is abnormal. Mild mitral valve regurgitation. No evidence of mitral stenosis. Severe mitral annular calcification. 5. The aortic valve is tricuspid. Aortic valve regurgitation is not visualized. Aortic valve sclerosis/calcification is present, without any evidence of aortic stenosis. 6. Aortic dilatation noted. There is dilatation of the aortic root, measuring 41 mm.  Comparison(s): EF 60%, MAC, mild LVH.  FINDINGS Left Ventricle: Left ventricular  ejection fraction, by estimation, is 60 to 65%. The left ventricle has normal function. The left ventricle has no regional wall motion abnormalities. The left ventricular internal cavity size was normal in size. There is moderate asymmetric left ventricular hypertrophy of the basal-septal segment. Left ventricular diastolic parameters are consistent with Grade II diastolic dysfunction (pseudonormalization). Elevated left atrial pressure.  Right Ventricle: The right ventricular size is normal. No increase in right ventricular wall thickness. Right  ventricular systolic function is normal.  Left Atrium: Left atrial size was moderately dilated.  Right Atrium: Right atrial size was normal in size.  Pericardium: There is no evidence of pericardial effusion.  Mitral Valve: The mitral valve is abnormal. Severe mitral annular calcification. Mild mitral valve regurgitation. No evidence of mitral valve stenosis.  Tricuspid Valve: The tricuspid valve is normal in structure. Tricuspid valve regurgitation is mild.  Aortic Valve: The aortic valve is tricuspid. Aortic valve regurgitation is not visualized. Aortic valve sclerosis/calcification is present, without any evidence of aortic stenosis. Aortic valve mean gradient measures 4.0 mmHg. Aortic valve peak gradient measures 8.3 mmHg. Aortic valve area, by VTI measures 2.36 cm.  Pulmonic Valve: The pulmonic valve was not well visualized. Pulmonic valve regurgitation is mild.  Aorta: Aortic dilatation noted. There is dilatation of the aortic root, measuring 41 mm.  IAS/Shunts: The interatrial septum was not well visualized.   LEFT VENTRICLE PLAX 2D LVIDd:         3.55 cm     Diastology LVIDs:         2.25 cm     LV e' medial:    5.55 cm/s LV PW:         1.00 cm     LV E/e' medial:  16.3 LV IVS:        1.38 cm     LV e' lateral:   6.20 cm/s LVOT diam:     2.30 cm     LV E/e' lateral: 14.6 LV SV:         78 LV SV Index:   37 LVOT Area:     4.15 cm  3D Volume EF: LV Volumes (MOD)           3D EF:        59 % LV vol d, MOD A2C: 49.0 ml LV EDV:       88 ml LV vol d, MOD A4C: 82.4 ml LV ESV:       36 ml LV vol s, MOD A2C: 12.5 ml LV SV:        52 ml LV vol s, MOD A4C: 17.9 ml LV SV MOD A2C:     36.5 ml LV SV MOD A4C:     82.4 ml LV SV MOD BP:      48.6 ml  RIGHT VENTRICLE RV S prime:     12.20 cm/s TAPSE (M-mode): 1.8 cm  LEFT ATRIUM              Index        RIGHT ATRIUM           Index LA diam:        3.70 cm  1.76 cm/m   RA Area:     14.60 cm LA Vol (A2C):   98.2 ml  46.80  ml/m  RA Volume:   36.60 ml  17.44 ml/m LA Vol (A4C):   87.4 ml  41.66 ml/m LA Biplane Vol: 100.0 ml 47.66 ml/m AORTIC VALVE  PULMONIC VALVE AV Area (Vmax):    2.42 cm      PV Vmax:          0.86 m/s AV Area (Vmean):   2.11 cm      PV Peak grad:     3.0 mmHg AV Area (VTI):     2.36 cm      PR End Diast Vel: 3.88 msec AV Vmax:           144.00 cm/s AV Vmean:          101.000 cm/s AV VTI:            0.329 m AV Peak Grad:      8.3 mmHg AV Mean Grad:      4.0 mmHg LVOT Vmax:         83.80 cm/s LVOT Vmean:        51.200 cm/s LVOT VTI:          0.187 m LVOT/AV VTI ratio: 0.57  AORTA Ao Root diam: 4.10 cm Ao Asc diam:  3.70 cm Ao Arch diam: 3.8 cm  MITRAL VALVE                TRICUSPID VALVE MV Area (PHT): 3.08 cm     TR Peak grad:   32.9 mmHg MV Decel Time: 246 msec     TR Vmax:        287.00 cm/s MV E velocity: 90.70 cm/s MV A velocity: 117.00 cm/s  SHUNTS MV E/A ratio:  0.78         Systemic VTI:  0.19 m Systemic Diam: 2.30 cm  Epifanio Lesches MD Electronically signed by Epifanio Lesches MD Signature Date/Time: 01/16/2022/10:39:05 AM    Final            Recent Labs: 12/26/2021: Magnesium 1.9 06/25/2022: ALT 34; BUN 19; Creatinine, Ser 1.22; Hemoglobin 14.9; Platelets 210; Potassium 4.9; Sodium 134; TSH 0.772  Recent Lipid Panel    Component Value Date/Time   CHOL 133 06/25/2022 1418   TRIG 53 06/25/2022 1418   HDL 61 06/25/2022 1418   CHOLHDL 2.2 06/25/2022 1418   CHOLHDL 2.9 05/16/2016 1127   VLDL 14 05/16/2016 1127   LDLCALC 60 06/25/2022 1418   LDLDIRECT 55 12/26/2021 1448    History of Present Illness    82 year old male with the above past medical history including hypertension, hyperlipidemia, DVT, prostate cancer s/p radiation seed, and adrenal hyperplasia.   He is on Eliquis for history of prior DVT.  He has followed with Dr. Tresa Endo primarily for the management of hypertension.  He has required titration of his medication  regimen in the setting of ongoing elevated BP, concern for side effects.  Most recent echocardiogram in November 2023 showed EF 60 to 65%, moderate LVH, G2 DD, mild MR, mild dilation of aortic root measuring 41 mm.  Carotid ultrasound in November 2023 showed 1 to 39% bilateral ICA stenoses.  He was last seen in the office on 06/25/2022 and was doing well from a cardiac standpoint.  He did note ongoing mild dizziness and fatigue.  Resting heart rate was in the 90s.  He was advised to increase his metoprolol to 25 mg twice daily.  He presents today for follow-up accompanied by his niece.  Since his last visit he has been stable overall from a cardiac standpoint.  He does note ongoing fatigue.  He continues to attribute his fatigue to a possible side effect from one of his medications. His BP  and heart rate have been stable.  He denies any chest pain, dyspnea, palpitations, dizziness, presyncope, syncope, edema, PND, orthopnea, weight gain.  Home Medications    Current Outpatient Medications  Medication Sig Dispense Refill   amLODipine (NORVASC) 5 MG tablet Take 1 tablet (5 mg total) by mouth daily. 90 tablet 3   atorvastatin (LIPITOR) 10 MG tablet TAKE 1 TABLET DAILY. PLEASE HAVE PATIENT GIVE OUR OFFICE A CALL FOR YEARLY APPT. 90 tablet 3   chlorthalidone (HYGROTON) 25 MG tablet Take 0.5 tablets (12.5 mg total) by mouth daily. 45 tablet 3   ELIQUIS 5 MG TABS tablet TAKE 1 TABLET BY MOUTH TWICE A DAY 180 tablet 1   MELATONIN GUMMIES PO Take 5 mg by mouth daily.     metoprolol succinate (TOPROL-XL) 25 MG 24 hr tablet Take 1 tablet (25 mg total) by mouth in the morning and at bedtime. Take with or immediately following a meal. 180 tablet 3   Multiple Vitamins-Minerals (MULTIVITAMIN) tablet Take 1 tablet by mouth daily.     Nutritional Supplements (ENSURE COMPLETE PO) Take by mouth daily.     olmesartan (BENICAR) 40 MG tablet Take 1 tablet (40 mg total) by mouth daily. 90 tablet 3   omeprazole (PRILOSEC) 20  MG capsule Take 20 mg by mouth daily as needed (heartburn).     tamsulosin (FLOMAX) 0.4 MG CAPS capsule Take 1 capsule (0.4 mg total) by mouth 2 (two) times daily. 90 capsule 0   Green Tea, Camillia sinensis, (GREEN TEA PO) Take by mouth. Cup of Tea daily. (Patient not taking: Reported on 08/01/2022)     No current facility-administered medications for this visit.     Review of Systems   He denies chest pain, palpitations, dyspnea, pnd, orthopnea, n, v, dizziness, syncope, edema, weight gain, or early satiety. All other systems reviewed and are otherwise negative except as noted above.   Physical Exam    VS:  BP 132/74 (BP Location: Left Arm, Patient Position: Sitting, Cuff Size: Large)   Pulse 83   Ht 6' (1.829 m)   Wt 206 lb 9.6 oz (93.7 kg)   SpO2 98%   BMI 28.02 kg/m  , BMI Body mass index is 28.02 kg/m. STOP-Bang Score:  6      GEN: Well nourished, well developed, in no acute distress. HEENT: normal. Neck: Supple, no JVD, carotid bruits, or masses. Cardiac: RRR, no murmurs, rubs, or gallops. No clubbing, cyanosis, edema.  Radials/DP/PT 2+ and equal bilaterally.  Respiratory:  Respirations regular and unlabored, clear to auscultation bilaterally. GI: Soft, nontender, nondistended, BS + x 4. MS: no deformity or atrophy. Skin: warm and dry, no rash. Neuro:  Strength and sensation are intact. Psych: Normal affect.  Accessory Clinical Findings    ECG personally reviewed by me today -no EKG in office today.   Lab Results  Component Value Date   WBC 5.6 06/25/2022   HGB 14.9 06/25/2022   HCT 42.3 06/25/2022   MCV 100 (H) 06/25/2022   PLT 210 06/25/2022   Lab Results  Component Value Date   CREATININE 1.22 06/25/2022   BUN 19 06/25/2022   NA 134 06/25/2022   K 4.9 06/25/2022   CL 95 (L) 06/25/2022   CO2 26 06/25/2022   Lab Results  Component Value Date   ALT 34 06/25/2022   AST 32 06/25/2022   ALKPHOS 86 06/25/2022   BILITOT 0.7 06/25/2022   Lab Results   Component Value Date   CHOL 133 06/25/2022  HDL 61 06/25/2022   LDLCALC 60 06/25/2022   LDLDIRECT 55 12/26/2021   TRIG 53 06/25/2022   CHOLHDL 2.2 06/25/2022    Lab Results  Component Value Date   HGBA1C 5.8 (H) 11/09/2021    Assessment & Plan    1. Hypertension: BP has been well-controlled.  However, he continues to note generalized fatigue which he attributes to a possible side effect from one of his BP medications.  Through shared decision making, will decrease amlodipine to 5 mg daily. Will stop valsartan and start olmesartan 40 mg daily.  Continue to monitor BP and report BP consistently greater than 140/80.  Continue metoprolol, chlorthalidone.   2. Fatigue/lightheadedness: Persistent. Recent echo and carotid Dopplers were stable.  Recent labs including CBC, BMET, TSH were normal. He is convinced that his symptoms are side effect of a current medication.  Will decrease amlodipine as above.  Will transition from valsartan to olmesartan as above.  If symptoms persist, consider discontinuing amlodipine. Sleep study was previously recommended, however, he has not pursued this.  We discussed this again today.  He prefers to address his current medications prior to considering sleep study.   3. Hyperlipidemia: LDL was 60 in 06/2022.  Continue atorvastatin.   4. History of DVT: Continue Eliquis.   5. Disposition: Follow-up in 4 to 6 weeks.     Joylene Grapes, NP 08/01/2022, 6:31 PM

## 2022-08-01 NOTE — Patient Instructions (Addendum)
Medication Instructions:  Decrease Amlodipine 5 mg daily Stop Valsartan as directed. Start Olmesartan 40 mg daily  *If you need a refill on your cardiac medications before your next appointment, please call your pharmacy*   Lab Work: NONE ordered at this time of appointment     Testing/Procedures: NONE ordered at this time of appointment     Follow-Up: At [facility], you and your health needs are our priority.  As part of our continuing mission to provide you with exceptional heart care, we have created designated Provider Care Teams.  These Care Teams include your primary Cardiologist (physician) and Advanced Practice Providers (APPs -  Physician Assistants and Nurse Practitioners) who all work together to provide you with the care you need, when you need it.  We recommend signing up for the patient portal called "MyChart".  Sign up information is provided on this After Visit Summary.  MyChart is used to connect with patients for Virtual Visits (Telemedicine).  Patients are able to view lab/test results, encounter notes, upcoming appointments, etc.  Non-urgent messages can be sent to your provider as well.   To learn more about what you can do with MyChart, go to [email or web address].    Your next appointment:   4-6 week(s)  Provider:   Bernadene Person, NP        Other Instructions Monitor blood pressure. Report BP consistently great than 140/80

## 2022-08-02 ENCOUNTER — Other Ambulatory Visit: Payer: Self-pay | Admitting: Cardiovascular Disease

## 2022-08-02 NOTE — Telephone Encounter (Signed)
Prescription refill request for Eliquis received. Indication: DVT Last office visit: 08/01/22  E Monge NP Scr: 1.22 on 06/25/22  Epic Age: 82 Weight: 93.7kg  Based on above findings Eliquis 5mg  twice daily is the appropriate dose.  Refill approved.

## 2022-08-09 ENCOUNTER — Telehealth: Payer: Self-pay | Admitting: Cardiovascular Disease

## 2022-08-09 NOTE — Telephone Encounter (Signed)
Patient states tired and off balance.  States "kinda cloudy".  He is not sure other than BP medication that could make him feel this way. This was noted at last OV and Metoprolol was increased to metoprolol 25 mg BID. Decreased Amlodipine to 5 mg Daily. Stopped Valsartan and Started Olmesartan 40 mg daily. Continues chlorthalidone 12.5 mg daily. Patient states he still does not feel well.  Please advise.   08/06/22:  4:30 pm after meds 138/76 HR 85   08/07/22:  8:30 am before meds 142/86 HR 67   08/09/22:  8:55 am before meds 148/87 HR 75 3:30 pm after meds 138/77 HR 76

## 2022-08-09 NOTE — Telephone Encounter (Signed)
Pt c/o BP issue: STAT if pt c/o blurred vision, one-sided weakness or slurred speech  1. What are your last 5 BP readings?   08/06/22:  4:30 pm after meds 138/76 HR 85  08/07/22:  8:30 am before meds 142/86 HR 67  08/09/22:  8:55 am before meds 148/87 HR 75 3:30 pm after meds 138/77 HR 76  2. Are you having any other symptoms (ex. Dizziness, headache, blurred vision, passed out)? Fatigued and off balance   3. What is your BP issue? Hypertension in the mornings before meds.

## 2022-08-09 NOTE — Telephone Encounter (Signed)
Patient called to add an additional BP reading:  6/14 9:10 am before medication   BP 152/91  HR 78

## 2022-08-10 NOTE — Telephone Encounter (Signed)
Spoke with patient and discussed recommendations from provider  For now, ok to stop amlodipine (he was concerned this was contributing to his to his symptoms at last OV). Continue to monitor BP and report BP consistently > 140/80 (after meds). Thank you -EM    He states understanding.  Will keep a log and call on Monday with BP results and how he is feeling.

## 2022-08-16 ENCOUNTER — Telehealth: Payer: Self-pay | Admitting: Nurse Practitioner

## 2022-08-16 NOTE — Telephone Encounter (Signed)
Patient states tired and washed out feeling. Last message on 6/20 was to stop the amlodipine as felt this was cause of fatigue.  This has not made a change in his over all feeling.  This is still on med list.  If do not want to re-start, will DC on list.   Reports BP readings Today 2:15pm 145/89 hr 70   Today 9:30am 155/91 hr 73   6/26 164/88 hr 71   6/25 141/78 hr 80  Please advise

## 2022-08-16 NOTE — Telephone Encounter (Signed)
Pt c/o BP issue: STAT if pt c/o blurred vision, one-sided weakness or slurred speech  1. What are your last 5 BP readings?   Today 2:15pm 145/89 hr 70  Today 9:30am 155/91 hr 73  6/26 164/88 hr 71  6/25 141/78 hr 80    2. Are you having any other symptoms (ex. Dizziness, headache, blurred vision, passed out)? Fatigue  3. What is your BP issue? Pt stated he was advised to call the office if BP elevates. Please advise

## 2022-08-17 NOTE — Telephone Encounter (Signed)
Patient aware of provider recommendations. He verbalized understanding and repeated medication changes back to me.

## 2022-08-17 NOTE — Telephone Encounter (Signed)
Left message for the pt to call the clinic

## 2022-08-17 NOTE — Telephone Encounter (Signed)
  Pt is returning call, he provided his reading last night BP 8 pm 135/76 97

## 2022-08-29 ENCOUNTER — Encounter: Payer: Self-pay | Admitting: Nurse Practitioner

## 2022-08-29 ENCOUNTER — Ambulatory Visit: Payer: Medicare Other | Attending: Nurse Practitioner | Admitting: Nurse Practitioner

## 2022-08-29 VITALS — BP 138/80 | HR 93 | Ht 72.0 in | Wt 205.0 lb

## 2022-08-29 DIAGNOSIS — R5383 Other fatigue: Secondary | ICD-10-CM

## 2022-08-29 DIAGNOSIS — E785 Hyperlipidemia, unspecified: Secondary | ICD-10-CM

## 2022-08-29 DIAGNOSIS — R42 Dizziness and giddiness: Secondary | ICD-10-CM | POA: Diagnosis not present

## 2022-08-29 DIAGNOSIS — I10 Essential (primary) hypertension: Secondary | ICD-10-CM | POA: Diagnosis not present

## 2022-08-29 DIAGNOSIS — Z86718 Personal history of other venous thrombosis and embolism: Secondary | ICD-10-CM

## 2022-08-29 NOTE — Patient Instructions (Signed)
Medication Instructions:  Hold Atorvastatin (Lipitor) 10 mg for 2 weeks. Call our office with an update.    *If you need a refill on your cardiac medications before your next appointment, please call your pharmacy*   Lab Work: NONE ordered at this time of appointment    Testing/Procedures:  NONE ordered at this time of appointment    Follow-Up: At [facility], you and your health needs are our priority.  As part of our continuing mission to provide you with exceptional heart care, we have created designated Provider Care Teams.  These Care Teams include your primary Cardiologist (physician) and Advanced Practice Providers (APPs -  Physician Assistants and Nurse Practitioners) who all work together to provide you with the care you need, when you need it.  We recommend signing up for the patient portal called "MyChart".  Sign up information is provided on this After Visit Summary.  MyChart is used to connect with patients for Virtual Visits (Telemedicine).  Patients are able to view lab/test results, encounter notes, upcoming appointments, etc.  Non-urgent messages can be sent to your provider as well.   To learn more about what you can do with MyChart, go to [email or web address].    Your next appointment:   1 month(s)  Provider:   Nicki Guadalajara, MD  or Bernadene Person, NP        Other Instructions Hold Atorvastatin (Lipitor) for 2 weeks. Call our office with an update.

## 2022-08-29 NOTE — Progress Notes (Signed)
Office Visit    Patient Name: Pedro Kent Date of Encounter: 08/29/2022  Primary Care Provider:  Irena Reichmann, DO Primary Cardiologist:  Nicki Guadalajara, MD  Chief Complaint    82 year old male with a history of hypertension, hyperlipidemia, mitral valve regurgitation, aortic root dilation, bilateral carotid artery stenosis, DVT, prostate cancer s/p radiation seed, and adrenal hyperplasia who presents today for follow-up related to hypertension.   Past Medical History    Past Medical History:  Diagnosis Date   Adrenal hyperplasia (HCC)    Cancer (HCC) 2010   prostate cancer   DVT (deep venous thrombosis) (HCC)    Hyperlipidemia    Hypertension    Past Surgical History:  Procedure Laterality Date   2-D echocardiogram  03/10/2009   Normal left ventricular systolic function. Mild MR. Normal RV systolic pressure. Mild pulmonic valvular regurgitation.   Cardiac stress test  04/05/2010   Negative for ischemia    Allergies  Allergies  Allergen Reactions   Ace Inhibitors Other (See Comments)    Unknown     Labs/Other Studies Reviewed    The following studies were reviewed today:  Cardiac Studies & Procedures     STRESS TESTS  NM MYOCAR MULTI W/SPECT W 12/16/2012   ECHOCARDIOGRAM  ECHOCARDIOGRAM COMPLETE 01/16/2022  Narrative ECHOCARDIOGRAM REPORT    Patient Name:   Pedro Kent Date of Exam: 01/15/2022 Medical Rec #:  [id number]       Height:       72.0 in Accession #:    [id number]      Weight:       192.9 lb Date of Birth:  24-Jun-1940       BSA:          2.098 m Patient Age:    81 years        BP:           151/84 mmHg Patient Gender: M               HR:           84 bpm. Exam Location:  Outpatient  Procedure: 2D Echo, 3D Echo, Cardiac Doppler and Color Doppler  Indications:    R53.83 Fatigue; R42 Lightheaded  History:        Patient has prior history of Echocardiogram examinations, most recent 09/23/2012.  Signs/Symptoms:Dizziness/Lightheadedness; Risk Factors:Hypertension, Dyslipidemia and Current Smoker. Patient denies chest pain, SOB or leg edema. Patient complains of overall fatigue, loss of stamina and lightheadedness with vertigo.  Sonographer:    Carlos American RVT, RDCS (AE), RDMS Referring Phys: [phone] [facility]   Sonographer Comments: Suboptimal apical window and suboptimal subcostal window. Image acquisition challenging due to respiratory motion. IMPRESSIONS   1. Left ventricular ejection fraction, by estimation, is 60 to 65%. The left ventricle has normal function. The left ventricle has no regional wall motion abnormalities. There is moderate asymmetric left ventricular hypertrophy of the basal-septal segment. Left ventricular diastolic parameters are consistent with Grade II diastolic dysfunction (pseudonormalization). Elevated left atrial pressure. 2. Right ventricular systolic function is normal. The right ventricular size is normal. 3. Left atrial size was moderately dilated. 4. The mitral valve is abnormal. Mild mitral valve regurgitation. No evidence of mitral stenosis. Severe mitral annular calcification. 5. The aortic valve is tricuspid. Aortic valve regurgitation is not visualized. Aortic valve sclerosis/calcification is present, without any evidence of aortic stenosis. 6. Aortic dilatation noted. There is dilatation of the aortic root, measuring 41 mm.  Comparison(s): EF 60%,  MAC, mild LVH.  FINDINGS Left Ventricle: Left ventricular ejection fraction, by estimation, is 60 to 65%. The left ventricle has normal function. The left ventricle has no regional wall motion abnormalities. The left ventricular internal cavity size was normal in size. There is moderate asymmetric left ventricular hypertrophy of the basal-septal segment. Left ventricular diastolic parameters are consistent with Grade II diastolic dysfunction (pseudonormalization). Elevated left atrial  pressure.  Right Ventricle: The right ventricular size is normal. No increase in right ventricular wall thickness. Right ventricular systolic function is normal.  Left Atrium: Left atrial size was moderately dilated.  Right Atrium: Right atrial size was normal in size.  Pericardium: There is no evidence of pericardial effusion.  Mitral Valve: The mitral valve is abnormal. Severe mitral annular calcification. Mild mitral valve regurgitation. No evidence of mitral valve stenosis.  Tricuspid Valve: The tricuspid valve is normal in structure. Tricuspid valve regurgitation is mild.  Aortic Valve: The aortic valve is tricuspid. Aortic valve regurgitation is not visualized. Aortic valve sclerosis/calcification is present, without any evidence of aortic stenosis. Aortic valve mean gradient measures 4.0 mmHg. Aortic valve peak gradient measures 8.3 mmHg. Aortic valve area, by VTI measures 2.36 cm.  Pulmonic Valve: The pulmonic valve was not well visualized. Pulmonic valve regurgitation is mild.  Aorta: Aortic dilatation noted. There is dilatation of the aortic root, measuring 41 mm.  IAS/Shunts: The interatrial septum was not well visualized.   LEFT VENTRICLE PLAX 2D LVIDd:         3.55 cm     Diastology LVIDs:         2.25 cm     LV e' medial:    5.55 cm/s LV PW:         1.00 cm     LV E/e' medial:  16.3 LV IVS:        1.38 cm     LV e' lateral:   6.20 cm/s LVOT diam:     2.30 cm     LV E/e' lateral: 14.6 LV SV:         78 LV SV Index:   37 LVOT Area:     4.15 cm  3D Volume EF: LV Volumes (MOD)           3D EF:        59 % LV vol d, MOD A2C: 49.0 ml LV EDV:       88 ml LV vol d, MOD A4C: 82.4 ml LV ESV:       36 ml LV vol s, MOD A2C: 12.5 ml LV SV:        52 ml LV vol s, MOD A4C: 17.9 ml LV SV MOD A2C:     36.5 ml LV SV MOD A4C:     82.4 ml LV SV MOD BP:      48.6 ml  RIGHT VENTRICLE RV S prime:     12.20 cm/s TAPSE (M-mode): 1.8 cm  LEFT ATRIUM              Index         RIGHT ATRIUM           Index LA diam:        3.70 cm  1.76 cm/m   RA Area:     14.60 cm LA Vol (A2C):   98.2 ml  46.80 ml/m  RA Volume:   36.60 ml  17.44 ml/m LA Vol (A4C):   87.4 ml  41.66 ml/m LA Biplane  Vol: 100.0 ml 47.66 ml/m AORTIC VALVE                     PULMONIC VALVE AV Area (Vmax):    2.42 cm      PV Vmax:          0.86 m/s AV Area (Vmean):   2.11 cm      PV Peak grad:     3.0 mmHg AV Area (VTI):     2.36 cm      PR End Diast Vel: 3.88 msec AV Vmax:           144.00 cm/s AV Vmean:          101.000 cm/s AV VTI:            0.329 m AV Peak Grad:      8.3 mmHg AV Mean Grad:      4.0 mmHg LVOT Vmax:         83.80 cm/s LVOT Vmean:        51.200 cm/s LVOT VTI:          0.187 m LVOT/AV VTI ratio: 0.57  AORTA Ao Root diam: 4.10 cm Ao Asc diam:  3.70 cm Ao Arch diam: 3.8 cm  MITRAL VALVE                TRICUSPID VALVE MV Area (PHT): 3.08 cm     TR Peak grad:   32.9 mmHg MV Decel Time: 246 msec     TR Vmax:        287.00 cm/s MV E velocity: 90.70 cm/s MV A velocity: 117.00 cm/s  SHUNTS MV E/A ratio:  0.78         Systemic VTI:  0.19 m Systemic Diam: 2.30 cm  Epifanio Lesches MD Electronically signed by Epifanio Lesches MD Signature Date/Time: 01/16/2022/10:39:05 AM    Final            Recent Labs: 12/26/2021: Magnesium 1.9 06/25/2022: ALT 34; BUN 19; Creatinine, Ser 1.22; Hemoglobin 14.9; Platelets 210; Potassium 4.9; Sodium 134; TSH 0.772  Recent Lipid Panel    Component Value Date/Time   CHOL 133 06/25/2022 1418   TRIG 53 06/25/2022 1418   HDL 61 06/25/2022 1418   CHOLHDL 2.2 06/25/2022 1418   CHOLHDL 2.9 05/16/2016 1127   VLDL 14 05/16/2016 1127   LDLCALC 60 06/25/2022 1418   LDLDIRECT 55 12/26/2021 1448    History of Present Illness    82 year old male with the above past medical history including hypertension, hyperlipidemia, mitral valve regurgitation, aortic root dilation, bilateral carotid artery stenosis, DVT, prostate cancer s/p  radiation seed, and adrenal hyperplasia.    He is on Eliquis for history of prior DVT.  He has followed with Dr. Tresa Endo primarily for the management of hypertension.  He has required titration of his medication regimen in the setting of ongoing elevated BP, concern for side effects.  Most recent echocardiogram in November 2023 showed EF 60 to 65%, moderate LVH, G2 DD, mild MR, mild dilation of aortic root measuring 41 mm.  Carotid ultrasound in November 2023 showed 1 to 39% bilateral ICA stenoses.  He was last seen in the office on 08/01/2022 and was stable from a cardiac standpoint.  He did note ongoing fatigue which he felt was likely a side effect from one of his medications.  Amlodipine was decreased to 5 mg daily. Additionally, he was transitioned from valsartan to olmesartan.  Amlodipine was ultimately discontinued however, patient  noticed no improvement in his symptoms with this change, BP remained elevated.  Therefore, amlodipine was reintroduced.  Metoprolol was decreased to 12.5 mg twice daily.   He presents today for follow-up accompanied by his niece.  Since his last visit he has been stable from a cardiac standpoint though he does note ongoing generalized fatigue, occasional blurry vision, mild gait instability, and feels generally unwell.  He notes mild dyspnea on exertion, he denies any chest pain, palpitations, edema, PND, orthopnea, weight gain.  BP has been stable.   Home Medications    Current Outpatient Medications  Medication Sig Dispense Refill   amLODipine (NORVASC) 5 MG tablet Take 1 tablet (5 mg total) by mouth daily. 90 tablet 3   atorvastatin (LIPITOR) 10 MG tablet TAKE 1 TABLET DAILY. PLEASE HAVE PATIENT GIVE OUR OFFICE A CALL FOR YEARLY APPT. (Patient taking differently: Hold Atorvastatin (Lipitor) for 2 weeks. Call our office with an update.) 90 tablet 3   chlorthalidone (HYGROTON) 25 MG tablet Take 0.5 tablets (12.5 mg total) by mouth daily. 45 tablet 3   ELIQUIS 5 MG TABS  tablet TAKE 1 TABLET BY MOUTH TWICE A DAY 60 tablet 5   Green Tea, Camillia sinensis, (GREEN TEA PO) Take by mouth. Cup of Tea daily.     MELATONIN GUMMIES PO Take 5 mg by mouth daily.     metoprolol succinate (TOPROL-XL) 25 MG 24 hr tablet Take 1 tablet (25 mg total) by mouth in the morning and at bedtime. Take with or immediately following a meal. 180 tablet 3   Multiple Vitamins-Minerals (MULTIVITAMIN) tablet Take 1 tablet by mouth daily.     Nutritional Supplements (ENSURE COMPLETE PO) Take by mouth daily.     olmesartan (BENICAR) 40 MG tablet Take 1 tablet (40 mg total) by mouth daily. 90 tablet 3   omeprazole (PRILOSEC) 20 MG capsule Take 20 mg by mouth daily as needed (heartburn).     tamsulosin (FLOMAX) 0.4 MG CAPS capsule Take 1 capsule (0.4 mg total) by mouth 2 (two) times daily. 90 capsule 0   No current facility-administered medications for this visit.     Review of Systems    He denies chest pain, palpitations, pnd, orthopnea, n, v, syncope, edema, weight gain, or early satiety. All other systems reviewed and are otherwise negative except as noted above.   Physical Exam    VS:  BP 138/80   Pulse 93   Ht 6' (1.829 m)   Wt 205 lb (93 kg)   SpO2 97%   BMI 27.80 kg/m  , BMI Body mass index is 27.8 kg/m. STOP-Bang Score:  6      GEN: Well nourished, well developed, in no acute distress. HEENT: normal. Neck: Supple, no JVD, carotid bruits, or masses. Cardiac: RRR, no murmurs, rubs, or gallops. No clubbing, cyanosis, edema.  Radials/DP/PT 2+ and equal bilaterally.  Respiratory:  Respirations regular and unlabored, clear to auscultation bilaterally. GI: Soft, nontender, nondistended, BS + x 4. MS: no deformity or atrophy. Skin: warm and dry, no rash. Neuro:  Strength and sensation are intact. Psych: Normal affect.  Accessory Clinical Findings    ECG personally reviewed by me today -    - no EKG in office today.   Lab Results  Component Value Date   WBC 5.6 06/25/2022    HGB 14.9 06/25/2022   HCT 42.3 06/25/2022   MCV 100 (H) 06/25/2022   PLT 210 06/25/2022   Lab Results  Component Value Date  CREATININE 1.22 06/25/2022   BUN 19 06/25/2022   NA 134 06/25/2022   K 4.9 06/25/2022   CL 95 (L) 06/25/2022   CO2 26 06/25/2022   Lab Results  Component Value Date   ALT 34 06/25/2022   AST 32 06/25/2022   ALKPHOS 86 06/25/2022   BILITOT 0.7 06/25/2022   Lab Results  Component Value Date   CHOL 133 06/25/2022   HDL 61 06/25/2022   LDLCALC 60 06/25/2022   LDLDIRECT 55 12/26/2021   TRIG 53 06/25/2022   CHOLHDL 2.2 06/25/2022    Lab Results  Component Value Date   HGBA1C 5.8 (H) 11/09/2021    Assessment & Plan    1. Hypertension: He has somewhat labile BP, overall well-controlled.  Continue to monitor BP and report BP consistently greater than 140/80.  Continue amlodipine, losartan, metoprolol, chlorthalidone.   2. Fatigue/lightheadedness/dyspnea on exertion/gait instability: He notes persistent fatigue, lightheadedness.  He also notes mild dyspnea on exertion, mild gait instability.  Denies chest pain.  He is convinced that his symptoms are a side effect of one of his medications.  No improvement with temporary discontinuation of amlodipine, no improvement with decrease metoprolol or transition from valsartan to olmesartan.  Will trial statin holiday.  Symptoms could also be potential side effect of tamsulosin.  I advised him to discuss this with his urologist.  Recent echo and carotid Dopplers were stable.  Recent labs including CBC, BMET, TSH were normal.  He declines sleep study.  We discussed possible stress test, possible repeat echocardiogram.  He declines any additional testing at this time.  If symptoms persist despite medication changes as above, consider additional testing/possible ischemic evaluation.   3.  Aortic root dilation: Aortic root measured 41 mm on most recent echo.  Repeat echo scheduled for 12/2022.  4. Carotid artery  stenosis: 1 to 39% bilateral carotid artery stenosis noted on carotid ultrasound in 12/2021.  Repeat carotid ultrasound scheduled for 12/2022.  5. Hyperlipidemia: LDL was 60 in 06/2022.  Continue atorvastatin.   6. History of DVT: Continue Eliquis.   7. Disposition: Follow-up in 1 month.     Joylene Grapes, NP 08/29/2022, 5:30 PM

## 2022-09-26 ENCOUNTER — Telehealth: Payer: Self-pay | Admitting: Nurse Practitioner

## 2022-09-26 NOTE — Telephone Encounter (Signed)
Patient states he would like to know the correct dosage of  metoprolol XL 25 mg. He states he thought you told him to take a half tab in am and half tab in the evening. Instructions says in med list say 25 mg BID. In your AVS on 7/10 it says he had a decrease but does not say what the decrease is. Med list does not reflect decrease. Can you please clarify.  He did not start  back on lipitor.  Explained to him that he was supposed to give [place] a call to see if his symptoms has improved. His symptoms has not improved. Today he feels fine. He has f/u with you on Friday.

## 2022-09-26 NOTE — Telephone Encounter (Signed)
Pt c/o medication issue:  1. Name of Medication:  metoprolol succinate (TOPROL-XL) 25 MG 24 hr tablet  2. How are you currently taking this medication (dosage and times per day)?   1/2 tablet in AM and 1/2 tablet at night  3. Are you having a reaction (difficulty breathing--STAT)?   4. What is your medication issue?   Patient states Bernadene Person, NP lowered his medication but he is still having the same side effects. He feels unbalanced and lightheaded.

## 2022-09-26 NOTE — Telephone Encounter (Signed)
Pt is returning call.  

## 2022-09-26 NOTE — Telephone Encounter (Signed)
Left voicemail to return call to office.

## 2022-09-27 NOTE — Progress Notes (Signed)
Cardiology Office Note:  .   Date:  09/28/2022  ID:  Pedro Kent, Pedro Kent 30-Oct-1940, MRN [id number] PCP: Pedro Reichmann, DO  [facility] Providers Cardiologist:  Pedro Guadalajara, MD    History of Present Illness: .   Pedro Kent is a 82 y.o. male with past medical history of hypertension, hyperlipidemia, mitral valve regurgitation, aortic root dilation, bilateral carotid artery stenosis, DVT, prostate cancer s/p radiation seed and adrenal hyperplasia.  He presents today for follow-up related to hypertension.  He is on Eliquis for history of prior DVT.  He is follows Dr. Tresa Endo primarily for the management of hypertension.  He has required titration of the medication regimen in the setting of ongoing elevated BP, concern for side effects.  Echocardiogram in November 2023 showed EF 60 to 65%, moderate LVH, G2 DD, mild MR, mild dilation of aortic root measuring 41 mm.  Carotid ultrasound in November 2023 showed 1 to 39% bilateral ICA stenosis.  At office visit on 08/01/2022 he was stable from a cardiac standpoint although he did note ongoing fatigue which he felt was likely to be a side effect of one his medications.  His amlodipine was decreased to 5 mg daily.  He was also transitioned from valsartan to olmesartan.  Metoprolol was decreased to 12.5 mg twice daily.  He was last seen in office on 08/29/2022 for follow-up.  He had remained stable from a cardiac standpoint though he did note ongoing fatigue, occasional blurry vision, mild gait instability and feeling unwell.  It was recommended that he trial a statin vacation and continue with decreased metoprolol.  Today he presents for follow-up regarding ongoing fatigue, DOE, lightheadedness and gait instability.  He reports no changes in symptoms with statin vacation, reports he resumed atorvastatin on 09/26/2022.  Reports following taking atorvastatin he started to feel better for one day but then the next day felt worse overall again.  He reports no  change in symptoms with any of the medication changes completed previously. Reviewed all prior changes with patient.  He has not yet followed up with urology as previously recommended regarding possible tamsulosin side effects.  He remains concerned that is symptoms are medication side effects.   ROS: Today he denies chest pain, lower extremity edema, palpitations, melena, hematuria, hemoptysis, diaphoresis, weakness, presyncope, syncope, orthopnea, and PND.   Studies Reviewed: .       Cardiac Studies & Procedures     STRESS TESTS  NM MYOCAR MULTI W/SPECT W 04/05/2010   ECHOCARDIOGRAM  ECHOCARDIOGRAM COMPLETE 01/15/2022  Narrative ECHOCARDIOGRAM REPORT    Patient Name:   Pedro Kent Date of Exam: 01/15/2022 Medical Rec #:  [id number]       Height:       72.0 in Accession #:    [id number]      Weight:       192.9 lb Date of Birth:  Jul 16, 1940       BSA:          2.098 m Patient Age:    81 years        BP:           151/84 mmHg Patient Gender: M               HR:           84 bpm. Exam Location:  Outpatient  Procedure: 2D Echo, 3D Echo, Cardiac Doppler and Color Doppler  Indications:    R53.83 Fatigue; R42 Lightheaded  History:  Patient has prior history of Echocardiogram examinations, most recent 09/23/2012. Signs/Symptoms:Dizziness/Lightheadedness; Risk Factors:Hypertension, Dyslipidemia and Current Smoker. Patient denies chest pain, SOB or leg edema. Patient complains of overall fatigue, loss of stamina and lightheadedness with vertigo.  Sonographer:    Carlos American RVT, RDCS (AE), RDMS Referring Phys: [phone] [facility]   Sonographer Comments: Suboptimal apical window and suboptimal subcostal window. Image acquisition challenging due to respiratory motion. IMPRESSIONS   1. Left ventricular ejection fraction, by estimation, is 60 to 65%. The left ventricle has normal function. The left ventricle has no regional wall motion abnormalities. There is moderate  asymmetric left ventricular hypertrophy of the basal-septal segment. Left ventricular diastolic parameters are consistent with Grade II diastolic dysfunction (pseudonormalization). Elevated left atrial pressure. 2. Right ventricular systolic function is normal. The right ventricular size is normal. 3. Left atrial size was moderately dilated. 4. The mitral valve is abnormal. Mild mitral valve regurgitation. No evidence of mitral stenosis. Severe mitral annular calcification. 5. The aortic valve is tricuspid. Aortic valve regurgitation is not visualized. Aortic valve sclerosis/calcification is present, without any evidence of aortic stenosis. 6. Aortic dilatation noted. There is dilatation of the aortic root, measuring 41 mm.  Comparison(s): EF 60%, MAC, mild LVH.  FINDINGS Left Ventricle: Left ventricular ejection fraction, by estimation, is 60 to 65%. The left ventricle has normal function. The left ventricle has no regional wall motion abnormalities. The left ventricular internal cavity size was normal in size. There is moderate asymmetric left ventricular hypertrophy of the basal-septal segment. Left ventricular diastolic parameters are consistent with Grade II diastolic dysfunction (pseudonormalization). Elevated left atrial pressure.  Right Ventricle: The right ventricular size is normal. No increase in right ventricular wall thickness. Right ventricular systolic function is normal.  Left Atrium: Left atrial size was moderately dilated.  Right Atrium: Right atrial size was normal in size.  Pericardium: There is no evidence of pericardial effusion.  Mitral Valve: The mitral valve is abnormal. Severe mitral annular calcification. Mild mitral valve regurgitation. No evidence of mitral valve stenosis.  Tricuspid Valve: The tricuspid valve is normal in structure. Tricuspid valve regurgitation is mild.  Aortic Valve: The aortic valve is tricuspid. Aortic valve regurgitation is not visualized.  Aortic valve sclerosis/calcification is present, without any evidence of aortic stenosis. Aortic valve mean gradient measures 4.0 mmHg. Aortic valve peak gradient measures 8.3 mmHg. Aortic valve area, by VTI measures 2.36 cm.  Pulmonic Valve: The pulmonic valve was not well visualized. Pulmonic valve regurgitation is mild.  Aorta: Aortic dilatation noted. There is dilatation of the aortic root, measuring 41 mm.  IAS/Shunts: The interatrial septum was not well visualized.   LEFT VENTRICLE PLAX 2D LVIDd:         3.55 cm     Diastology LVIDs:         2.25 cm     LV e' medial:    5.55 cm/s LV PW:         1.00 cm     LV E/e' medial:  16.3 LV IVS:        1.38 cm     LV e' lateral:   6.20 cm/s LVOT diam:     2.30 cm     LV E/e' lateral: 14.6 LV SV:         78 LV SV Index:   37 LVOT Area:     4.15 cm  3D Volume EF: LV Volumes (MOD)           3D EF:  59 % LV vol d, MOD A2C: 49.0 ml LV EDV:       88 ml LV vol d, MOD A4C: 82.4 ml LV ESV:       36 ml LV vol s, MOD A2C: 12.5 ml LV SV:        52 ml LV vol s, MOD A4C: 17.9 ml LV SV MOD A2C:     36.5 ml LV SV MOD A4C:     82.4 ml LV SV MOD BP:      48.6 ml  RIGHT VENTRICLE RV S prime:     12.20 cm/s TAPSE (M-mode): 1.8 cm  LEFT ATRIUM              Index        RIGHT ATRIUM           Index LA diam:        3.70 cm  1.76 cm/m   RA Area:     14.60 cm LA Vol (A2C):   98.2 ml  46.80 ml/m  RA Volume:   36.60 ml  17.44 ml/m LA Vol (A4C):   87.4 ml  41.66 ml/m LA Biplane Vol: 100.0 ml 47.66 ml/m AORTIC VALVE                     PULMONIC VALVE AV Area (Vmax):    2.42 cm      PV Vmax:          0.86 m/s AV Area (Vmean):   2.11 cm      PV Peak grad:     3.0 mmHg AV Area (VTI):     2.36 cm      PR End Diast Vel: 3.88 msec AV Vmax:           144.00 cm/s AV Vmean:          101.000 cm/s AV VTI:            0.329 m AV Peak Grad:      8.3 mmHg AV Mean Grad:      4.0 mmHg LVOT Vmax:         83.80 cm/s LVOT Vmean:        51.200  cm/s LVOT VTI:          0.187 m LVOT/AV VTI ratio: 0.57  AORTA Ao Root diam: 4.10 cm Ao Asc diam:  3.70 cm Ao Arch diam: 3.8 cm  MITRAL VALVE                TRICUSPID VALVE MV Area (PHT): 3.08 cm     TR Peak grad:   32.9 mmHg MV Decel Time: 246 msec     TR Vmax:        287.00 cm/s MV E velocity: 90.70 cm/s MV A velocity: 117.00 cm/s  SHUNTS MV E/A ratio:  0.78         Systemic VTI:  0.19 m Systemic Diam: 2.30 cm  Epifanio Lesches MD Electronically signed by Epifanio Lesches MD Signature Date/Time: 01/16/2022/10:39:05 AM    Final             Risk Assessment/Calculations:     STOP-Bang Score:  5      Physical Exam:   VS:  BP 130/72   Pulse 84   Ht 6' (1.829 m)   Wt 202 lb (91.6 kg)   BMI 27.40 kg/m    Wt Readings from Last 3 Encounters:  09/28/22 202 lb (91.6 kg)  08/29/22  205 lb (93 kg)  08/01/22 206 lb 9.6 oz (93.7 kg)    GEN: Well nourished, well developed in no acute distress NECK: No JVD; No carotid bruits CARDIAC: RRR, no murmurs, rubs, gallops RESPIRATORY:  Clear to auscultation without rales, wheezing or rhonchi  ABDOMEN: Soft, non-tender, non-distended EXTREMITIES:  No edema; No deformity   ASSESSMENT AND PLAN: .    Hypertension: Blood pressure today 130/72.  Reviewed home blood pressure log he is consistently below 140/80, overall appears labile, rare episodes of decreased blood pressure 100/60 with return to average BP within an hour.  Reports no change in symptoms of fatigue and lightheadedness with prior medication changes.  Continue amlodipine, Olmesartan, metoprolol, and chlorthalidone.  Fatigue/lightheadedness/DOE/gait instability: At last office visit he noted persistent fatigue and lightheadedness as well as mild dyspnea on exertion and gait instability.  He expressed concerns that his side effects were from one of his medications. Previously trialed temporary discontinuation of amlodipine with no improvement, he had no improvement with  decreased metoprolol or with transition from valsartan to olmesartan.  Recent labs including CBC, BMET, TSH were normal. Today he reports no change in symptoms with statin holiday, reports that he restarted his atorvastatin 09/26/22.  Today we reviewed all of his recent lab results as well as prior medication changes. With ongoing symptoms even with medication changes, recommended sleep study, repeat echocardiogram or stress testing for ischemic evaluation, he declines all testing at this time.  He will follow-up with his urologist to trial potential change in tamsulosin.  Discussed that if he would like to have ischemic evaluation to notify the office for sooner appointment.  Reviewed ED precautions. Continue current medications as above.   Aortic root dilation: Aortic root measured 41 mm on most recent echo.  Repeat echo scheduled for 12/2022.  Carotid artery stenosis: 1 to 39% bilateral carotid artery stenosis noted on carotid ultrasound in 12/2021.  Repeat carotid ultrasound scheduled for 11/24.  Hyperlipidemia: Lipid profile in 5/24 indicated LDL of 60.  He has ongoing symptoms of fatigue, gait instability, not improved with statin holiday. (See #2).  Continue atorvastatin.  History of DVT: Continue Eliquis.  Sleep disordered breathing: STOP-BANG score of 5.  Discussed that sleep apnea could be causing fatigue.  Patient reports that he will think about sleep study, deferred for now.  Tobacco abuse: He reports smoking on average 4 small cigars a day.  Recommended cessation, he is not interested at this time.       Dispo: Follow up with Reather Littler or Bernadene Person, NP in 2 months.   Signed, Rip Harbour, NP

## 2022-09-27 NOTE — Telephone Encounter (Signed)
Patient is aware to continue to take medications as he has been taking them and he can discuss at appointment tomorrow

## 2022-09-28 ENCOUNTER — Encounter: Payer: Self-pay | Admitting: Cardiology

## 2022-09-28 ENCOUNTER — Ambulatory Visit: Payer: Medicare Other | Attending: Nurse Practitioner | Admitting: Cardiology

## 2022-09-28 VITALS — BP 130/72 | HR 84 | Ht 72.0 in | Wt 202.0 lb

## 2022-09-28 DIAGNOSIS — R5383 Other fatigue: Secondary | ICD-10-CM | POA: Diagnosis not present

## 2022-09-28 DIAGNOSIS — G4730 Sleep apnea, unspecified: Secondary | ICD-10-CM

## 2022-09-28 DIAGNOSIS — I10 Essential (primary) hypertension: Secondary | ICD-10-CM | POA: Diagnosis not present

## 2022-09-28 DIAGNOSIS — E785 Hyperlipidemia, unspecified: Secondary | ICD-10-CM | POA: Diagnosis not present

## 2022-09-28 DIAGNOSIS — Z720 Tobacco use: Secondary | ICD-10-CM

## 2022-09-28 DIAGNOSIS — Z86718 Personal history of other venous thrombosis and embolism: Secondary | ICD-10-CM

## 2022-09-28 DIAGNOSIS — R42 Dizziness and giddiness: Secondary | ICD-10-CM

## 2022-09-28 DIAGNOSIS — D6859 Other primary thrombophilia: Secondary | ICD-10-CM

## 2022-09-28 NOTE — Patient Instructions (Addendum)
Medication Instructions:  Your physician recommends that you continue on your current medications as directed. Please refer to the Current Medication list given to you today.  *If you need a refill on your cardiac medications before your next appointment, please call your pharmacy*   Lab Work: NONE ordered at this time of appointment     Testing/Procedures: NONE ordered at this time of appointment     Follow-Up: At [facility], you and your health needs are our priority.  As part of our continuing mission to provide you with exceptional heart care, we have created designated Provider Care Teams.  These Care Teams include your primary Cardiologist (physician) and Advanced Practice Providers (APPs -  Physician Assistants and Nurse Practitioners) who all work together to provide you with the care you need, when you need it.  We recommend signing up for the patient portal called "MyChart".  Sign up information is provided on this After Visit Summary.  MyChart is used to connect with patients for Virtual Visits (Telemedicine).  Patients are able to view lab/test results, encounter notes, upcoming appointments, etc.  Non-urgent messages can be sent to your provider as well.   To learn more about what you can do with MyChart, go to [email or web address].    Your next appointment:   2 month(s)  Provider:   Reather Littler NP Marjory Sneddon, NP

## 2022-10-07 ENCOUNTER — Other Ambulatory Visit: Payer: Self-pay | Admitting: Cardiovascular Disease

## 2022-11-05 ENCOUNTER — Other Ambulatory Visit: Payer: Self-pay | Admitting: Cardiovascular Disease

## 2022-11-08 ENCOUNTER — Encounter (HOSPITAL_BASED_OUTPATIENT_CLINIC_OR_DEPARTMENT_OTHER): Payer: Self-pay

## 2022-11-28 DIAGNOSIS — Z8546 Personal history of malignant neoplasm of prostate: Secondary | ICD-10-CM | POA: Diagnosis not present

## 2022-12-03 ENCOUNTER — Ambulatory Visit: Payer: Medicare Other | Attending: Nurse Practitioner | Admitting: Nurse Practitioner

## 2022-12-03 ENCOUNTER — Encounter: Payer: Self-pay | Admitting: Nurse Practitioner

## 2022-12-03 VITALS — BP 130/58 | HR 102 | Ht 72.0 in | Wt 205.0 lb

## 2022-12-03 DIAGNOSIS — I10 Essential (primary) hypertension: Secondary | ICD-10-CM

## 2022-12-03 DIAGNOSIS — R42 Dizziness and giddiness: Secondary | ICD-10-CM | POA: Diagnosis not present

## 2022-12-03 DIAGNOSIS — R5383 Other fatigue: Secondary | ICD-10-CM

## 2022-12-03 DIAGNOSIS — G4730 Sleep apnea, unspecified: Secondary | ICD-10-CM

## 2022-12-03 DIAGNOSIS — I6523 Occlusion and stenosis of bilateral carotid arteries: Secondary | ICD-10-CM

## 2022-12-03 DIAGNOSIS — I77810 Thoracic aortic ectasia: Secondary | ICD-10-CM

## 2022-12-03 DIAGNOSIS — E785 Hyperlipidemia, unspecified: Secondary | ICD-10-CM

## 2022-12-03 DIAGNOSIS — Z86718 Personal history of other venous thrombosis and embolism: Secondary | ICD-10-CM

## 2022-12-03 NOTE — Progress Notes (Signed)
Office Visit    Patient Name: Pedro Kent Date of Encounter: 12/03/2022  Primary Care Provider:  No primary care provider on file. Primary Cardiologist:  Nicki Guadalajara, MD  Chief Complaint    82 year old male with a history of hypertension, hyperlipidemia, carotid artery stenosis, aortic root dilation, DVT, prostate cancer s/p radiation seed, and adrenal hyperplasia who presents for follow-up related to hypertension, dizziness and fatigue.   Past Medical History    Past Medical History:  Diagnosis Date   Adrenal hyperplasia (HCC)    Cancer (HCC) 2010   prostate cancer   DVT (deep venous thrombosis) (HCC)    Hyperlipidemia    Hypertension    Past Surgical History:  Procedure Laterality Date   2-D echocardiogram  03/10/2009   Normal left ventricular systolic function. Mild MR. Normal RV systolic pressure. Mild pulmonic valvular regurgitation.   Cardiac stress test  04/05/2010   Negative for ischemia    Allergies  Allergies  Allergen Reactions   Ace Inhibitors Other (See Comments)    Unknown     Labs/Other Studies Reviewed    The following studies were reviewed today:  Cardiac Studies & Procedures     STRESS TESTS  NM MYOCAR MULTI W/SPECT W 04/05/2010   ECHOCARDIOGRAM  ECHOCARDIOGRAM COMPLETE 01/15/2022  Narrative ECHOCARDIOGRAM REPORT    Patient Name:   Pedro Kent Date of Exam: 01/15/2022 Medical Rec #:  [id number]       Height:       72.0 in Accession #:    [id number]      Weight:       192.9 lb Date of Birth:  [birth year]       BSA:          2.098 m Patient Age:    81 years        BP:           151/84 mmHg Patient Gender: M               HR:           84 bpm. Exam Location:  Outpatient  Procedure: 2D Echo, 3D Echo, Cardiac Doppler and Color Doppler  Indications:    R53.83 Fatigue; R42 Lightheaded  History:        Patient has prior history of Echocardiogram examinations, most recent 09/23/2012. Signs/Symptoms:Dizziness/Lightheadedness;  Risk Factors:Hypertension, Dyslipidemia and Current Smoker. Patient denies chest pain, SOB or leg edema. Patient complains of overall fatigue, loss of stamina and lightheadedness with vertigo.  Sonographer:    Carlos American RVT, RDCS (AE), RDMS Referring Phys: [phone] [facility]   Sonographer Comments: Suboptimal apical window and suboptimal subcostal window. Image acquisition challenging due to respiratory motion. IMPRESSIONS   1. Left ventricular ejection fraction, by estimation, is 60 to 65%. The left ventricle has normal function. The left ventricle has no regional wall motion abnormalities. There is moderate asymmetric left ventricular hypertrophy of the basal-septal segment. Left ventricular diastolic parameters are consistent with Grade II diastolic dysfunction (pseudonormalization). Elevated left atrial pressure. 2. Right ventricular systolic function is normal. The right ventricular size is normal. 3. Left atrial size was moderately dilated. 4. The mitral valve is abnormal. Mild mitral valve regurgitation. No evidence of mitral stenosis. Severe mitral annular calcification. 5. The aortic valve is tricuspid. Aortic valve regurgitation is not visualized. Aortic valve sclerosis/calcification is present, without any evidence of aortic stenosis. 6. Aortic dilatation noted. There is dilatation of the aortic root, measuring 41 mm.  Comparison(s): EF  60%, MAC, mild LVH.  FINDINGS Left Ventricle: Left ventricular ejection fraction, by estimation, is 60 to 65%. The left ventricle has normal function. The left ventricle has no regional wall motion abnormalities. The left ventricular internal cavity size was normal in size. There is moderate asymmetric left ventricular hypertrophy of the basal-septal segment. Left ventricular diastolic parameters are consistent with Grade II diastolic dysfunction (pseudonormalization). Elevated left atrial pressure.  Right Ventricle: The right  ventricular size is normal. No increase in right ventricular wall thickness. Right ventricular systolic function is normal.  Left Atrium: Left atrial size was moderately dilated.  Right Atrium: Right atrial size was normal in size.  Pericardium: There is no evidence of pericardial effusion.  Mitral Valve: The mitral valve is abnormal. Severe mitral annular calcification. Mild mitral valve regurgitation. No evidence of mitral valve stenosis.  Tricuspid Valve: The tricuspid valve is normal in structure. Tricuspid valve regurgitation is mild.  Aortic Valve: The aortic valve is tricuspid. Aortic valve regurgitation is not visualized. Aortic valve sclerosis/calcification is present, without any evidence of aortic stenosis. Aortic valve mean gradient measures 4.0 mmHg. Aortic valve peak gradient measures 8.3 mmHg. Aortic valve area, by VTI measures 2.36 cm.  Pulmonic Valve: The pulmonic valve was not well visualized. Pulmonic valve regurgitation is mild.  Aorta: Aortic dilatation noted. There is dilatation of the aortic root, measuring 41 mm.  IAS/Shunts: The interatrial septum was not well visualized.   LEFT VENTRICLE PLAX 2D LVIDd:         3.55 cm     Diastology LVIDs:         2.25 cm     LV e' medial:    5.55 cm/s LV PW:         1.00 cm     LV E/e' medial:  16.3 LV IVS:        1.38 cm     LV e' lateral:   6.20 cm/s LVOT diam:     2.30 cm     LV E/e' lateral: 14.6 LV SV:         78 LV SV Index:   37 LVOT Area:     4.15 cm  3D Volume EF: LV Volumes (MOD)           3D EF:        59 % LV vol d, MOD A2C: 49.0 ml LV EDV:       88 ml LV vol d, MOD A4C: 82.4 ml LV ESV:       36 ml LV vol s, MOD A2C: 12.5 ml LV SV:        52 ml LV vol s, MOD A4C: 17.9 ml LV SV MOD A2C:     36.5 ml LV SV MOD A4C:     82.4 ml LV SV MOD BP:      48.6 ml  RIGHT VENTRICLE RV S prime:     12.20 cm/s TAPSE (M-mode): 1.8 cm  LEFT ATRIUM              Index        RIGHT ATRIUM           Index LA diam:         3.70 cm  1.76 cm/m   RA Area:     14.60 cm LA Vol (A2C):   98.2 ml  46.80 ml/m  RA Volume:   36.60 ml  17.44 ml/m LA Vol (A4C):   87.4 ml  41.66 ml/m LA  Biplane Vol: 100.0 ml 47.66 ml/m AORTIC VALVE                     PULMONIC VALVE AV Area (Vmax):    2.42 cm      PV Vmax:          0.86 m/s AV Area (Vmean):   2.11 cm      PV Peak grad:     3.0 mmHg AV Area (VTI):     2.36 cm      PR End Diast Vel: 3.88 msec AV Vmax:           144.00 cm/s AV Vmean:          101.000 cm/s AV VTI:            0.329 m AV Peak Grad:      8.3 mmHg AV Mean Grad:      4.0 mmHg LVOT Vmax:         83.80 cm/s LVOT Vmean:        51.200 cm/s LVOT VTI:          0.187 m LVOT/AV VTI ratio: 0.57  AORTA Ao Root diam: 4.10 cm Ao Asc diam:  3.70 cm Ao Arch diam: 3.8 cm  MITRAL VALVE                TRICUSPID VALVE MV Area (PHT): 3.08 cm     TR Peak grad:   32.9 mmHg MV Decel Time: 246 msec     TR Vmax:        287.00 cm/s MV E velocity: 90.70 cm/s MV A velocity: 117.00 cm/s  SHUNTS MV E/A ratio:  0.78         Systemic VTI:  0.19 m Systemic Diam: 2.30 cm  Epifanio Lesches MD Electronically signed by Epifanio Lesches MD Signature Date/Time: 01/16/2022/10:39:05 AM    Final            Recent Labs: 12/26/2021: Magnesium 1.9 06/25/2022: ALT 34; BUN 19; Creatinine, Ser 1.22; Hemoglobin 14.9; Platelets 210; Potassium 4.9; Sodium 134; TSH 0.772  Recent Lipid Panel    Component Value Date/Time   CHOL 133 06/25/2022 1418   TRIG 53 06/25/2022 1418   HDL 61 06/25/2022 1418   CHOLHDL 2.2 06/25/2022 1418   CHOLHDL 2.9 05/16/2016 1127   VLDL 14 05/16/2016 1127   LDLCALC 60 06/25/2022 1418   LDLDIRECT 55 12/26/2021 1448    History of Present Illness    82 year old male with the above past medical history including hypertension, hyperlipidemia, carotid artery stenosis, aortic root dilation, DVT, prostate cancer s/p radiation seed, and adrenal hyperplasia.   He is on Eliquis for history of prior  DVT.  He has followed with Dr. Tresa Endo primarily for the management of hypertension.  He has required titration of his medication regimen in the setting of ongoing elevated BP, concern for side effects.  Most recent echocardiogram in November 2023 showed EF 60 to 65%, moderate LVH, G2 DD, mild MR, mild dilation of aortic root measuring 41 mm.  Carotid ultrasound in November 2023 showed 1 to 39% bilateral ICA stenoses.  He was last seen in the office on 09/28/2022 and noted ongoing mild dizziness and fatigue, labile BP.  He felt that his symptoms were likely related to tamsulosin.  He declined sleep study, echocardiogram, or stress testing.  Resting heart rate was in the 90s.  He was advised to increase his metoprolol to 25 mg twice daily.   He  presents today for follow-up accompanied by his niece.  Since his last visit he has been stable overall from a cardiac standpoint.  He does note ongoing fatigue, occasional shortness of breath, mild gait instability.  Overall, his symptoms are unchanged from prior visits. BP has been stable.  He does note a slightly productive cough, denies any associated symptoms.  He denies chest pain, palpitations, edema, PND, orthopnea, weight gain.  He notes he is only taking 12.5 of metoprolol succinate in the morning and additional metoprolol succinate 25 mg at bedtime.  He has a follow-up appointment with his urologist to discuss possible discontinuation of Flomax/possible side effects.  Home Medications    Current Outpatient Medications  Medication Sig Dispense Refill   atorvastatin (LIPITOR) 10 MG tablet TAKE 1 TABLET DAILY. PLEASE HAVE PATIENT GIVE OUR OFFICE A CALL FOR YEARLY APPT. 90 tablet 3   chlorthalidone (HYGROTON) 25 MG tablet Take 0.5 tablets (12.5 mg total) by mouth daily. 45 tablet 3   ELIQUIS 5 MG TABS tablet TAKE 1 TABLET BY MOUTH TWICE A DAY 60 tablet 5   Green Tea, Camillia sinensis, (GREEN TEA PO) Take by mouth. Cup of Tea daily.     MELATONIN GUMMIES PO Take  5 mg by mouth daily.     metoprolol succinate (TOPROL-XL) 25 MG 24 hr tablet Take 1 tablet (25 mg total) by mouth in the morning and at bedtime. Take with or immediately following a meal. (Patient taking differently: Take 12.5 mg by mouth in the morning and at bedtime. Take 1/2 tablet by mouth twice daily) 180 tablet 3   metoprolol tartrate (LOPRESSOR) 50 MG tablet TAKE 25 MG (0.5 TABLET) IN THE MORNING, AND 50 MG (1 TABLET) IN THE EVENING 135 tablet 3   Multiple Vitamins-Minerals (MULTIVITAMIN) tablet Take 1 tablet by mouth daily.     Nutritional Supplements (ENSURE COMPLETE PO) Take by mouth daily.     olmesartan (BENICAR) 40 MG tablet Take 1 tablet (40 mg total) by mouth daily. 90 tablet 3   omeprazole (PRILOSEC) 20 MG capsule Take 20 mg by mouth daily as needed (heartburn).     tamsulosin (FLOMAX) 0.4 MG CAPS capsule Take 1 capsule (0.4 mg total) by mouth 2 (two) times daily. 90 capsule 0   amLODipine (NORVASC) 5 MG tablet Take 1 tablet (5 mg total) by mouth daily. 90 tablet 3   No current facility-administered medications for this visit.     Review of Systems    He denies chest pain, palpitations, dyspnea, pnd, orthopnea, n, v, syncope, edema, weight gain, or early satiety. All other systems reviewed and are otherwise negative except as noted above.   Physical Exam    VS:  BP (!) 130/58 (BP Location: Left Arm, Patient Position: Sitting, Cuff Size: Normal)   Pulse (!) 102   Ht 6' (1.829 m)   Wt 205 lb (93 kg)   SpO2 98%   BMI 27.80 kg/m  STOP-Bang Score:  5      GEN: Well nourished, well developed, in no acute distress. HEENT: normal.  Neck: Supple, no JVD, carotid bruits, or masses. Cardiac: RRR, no murmurs, rubs, or gallops. No clubbing, cyanosis, edema.  Radials/DP/PT 2+ and equal bilaterally.  Respiratory:  Respirations regular and unlabored, clear to auscultation bilaterally. GI: Soft, nontender, nondistended, BS + x 4. MS: no deformity or atrophy. Skin: warm and dry, no  rash. Neuro:  Strength and sensation are intact. Psych: Normal affect.  Accessory Clinical Findings    ECG personally  reviewed by me today -    - no EKG in office today.   Lab Results  Component Value Date   WBC 5.6 06/25/2022   HGB 14.9 06/25/2022   HCT 42.3 06/25/2022   MCV 100 (H) 06/25/2022   PLT 210 06/25/2022   Lab Results  Component Value Date   CREATININE 1.22 06/25/2022   BUN 19 06/25/2022   NA 134 06/25/2022   K 4.9 06/25/2022   CL 95 (L) 06/25/2022   CO2 26 06/25/2022   Lab Results  Component Value Date   ALT 34 06/25/2022   AST 32 06/25/2022   ALKPHOS 86 06/25/2022   BILITOT 0.7 06/25/2022   Lab Results  Component Value Date   CHOL 133 06/25/2022   HDL 61 06/25/2022   LDLCALC 60 06/25/2022   LDLDIRECT 55 12/26/2021   TRIG 53 06/25/2022   CHOLHDL 2.2 06/25/2022    Lab Results  Component Value Date   HGBA1C 5.8 (H) 11/09/2021    Assessment & Plan    1. Hypertension: BP has been well-controlled.  This has been after several medication changes.  He is currently taking metoprolol succinate 12.5 mg in the morning and 25 mg in the evening.  He wishes to continue this current dosing schedule.  Otherwise, continue current antihypertensive regimen.   2. Fatigue/lightheadedness: Persistent despite adjustments in medication.  Notes mild gait instability, denies any palpitations, presyncope or syncope.  Recent echo and carotid Dopplers were stable. Labs including CBC, BMET, TSH have been unremarkable. He is convinced that his symptoms are side effect of a current medication. Sleep study was previously recommended, however, he has repeatedly declined.  He is due for repeat echocardiogram monitoring of aortic root dilation, I asked him to please schedule this.  We discussed possible ischemic evaluation (I.e. stress test), however, he declines. He thinks that his symptoms may be related to tamsulosin.  He has follow-up scheduled with urology to discuss this further.   Continue to monitor symptoms.   3. Hyperlipidemia: LDL was 60 in 06/2022.  Continue atorvastatin.   4. History of DVT: Continue Eliquis.  5. Carotid artery stenosis: 1 to 39% bilateral carotid artery stenosis noted on carotid ultrasound in 12/2021.  He is due for repeat carotid ultrasound, I have asked him to schedule this.  6. Aortic root dilation:  Aortic root measured 41 mm on most recent echo.  Repeat echo ending as above.  7. Sleep disordered breathing: Declines sleep study.    5. Disposition: Follow-up next available with Dr. Tresa Endo.      Joylene Grapes, NP 12/03/2022, 3:12 PM

## 2022-12-03 NOTE — Patient Instructions (Signed)
Medication Instructions:  Stop Metoprolol Tartrate as directed.  Please take Metoprolol 12.5 mg (half tablet) in the morning and 25 mg (1 tablet) in the evening.   *If you need a refill on your cardiac medications before your next appointment, please call your pharmacy*   Lab Work: NONE ordered at this time of appointment   Testing/Procedures: NONE ordered at this time of appointment   Follow-Up: At [facility], you and your health needs are our priority.  As part of our continuing mission to provide you with exceptional heart care, we have created designated Provider Care Teams.  These Care Teams include your primary Cardiologist (physician) and Advanced Practice Providers (APPs -  Physician Assistants and Nurse Practitioners) who all work together to provide you with the care you need, when you need it.  We recommend signing up for the patient portal called "MyChart".  Sign up information is provided on this After Visit Summary.  MyChart is used to connect with patients for Virtual Visits (Telemedicine).  Patients are able to view lab/test results, encounter notes, upcoming appointments, etc.  Non-urgent messages can be sent to your provider as well.   To learn more about what you can do with MyChart, go to [email or web address].    Your next appointment:   Next available Dr. Tresa Endo   Provider:   Nicki Guadalajara, MD

## 2022-12-05 DIAGNOSIS — Z8546 Personal history of malignant neoplasm of prostate: Secondary | ICD-10-CM | POA: Diagnosis not present

## 2022-12-05 DIAGNOSIS — R351 Nocturia: Secondary | ICD-10-CM | POA: Diagnosis not present

## 2022-12-05 DIAGNOSIS — N401 Enlarged prostate with lower urinary tract symptoms: Secondary | ICD-10-CM | POA: Diagnosis not present

## 2022-12-29 ENCOUNTER — Other Ambulatory Visit: Payer: Self-pay | Admitting: Nurse Practitioner

## 2023-01-31 ENCOUNTER — Other Ambulatory Visit: Payer: Self-pay | Admitting: Cardiovascular Disease

## 2023-02-01 NOTE — Telephone Encounter (Signed)
Prescription refill request for Eliquis received. Indication:dvt Last office visit:10/24 Scr:1.22  5/24 Age: 82 Weight:93  kg  Prescription refilled

## 2023-02-27 ENCOUNTER — Encounter: Payer: Self-pay | Admitting: Cardiovascular Disease

## 2023-02-27 ENCOUNTER — Ambulatory Visit: Payer: Medicare Other | Attending: Cardiovascular Disease | Admitting: Cardiovascular Disease

## 2023-02-27 VITALS — BP 126/86 | HR 79 | Ht 72.0 in | Wt 166.4 lb

## 2023-02-27 DIAGNOSIS — I6523 Occlusion and stenosis of bilateral carotid arteries: Secondary | ICD-10-CM

## 2023-02-27 DIAGNOSIS — D6859 Other primary thrombophilia: Secondary | ICD-10-CM

## 2023-02-27 DIAGNOSIS — E7800 Pure hypercholesterolemia, unspecified: Secondary | ICD-10-CM | POA: Diagnosis not present

## 2023-02-27 DIAGNOSIS — Z8546 Personal history of malignant neoplasm of prostate: Secondary | ICD-10-CM

## 2023-02-27 DIAGNOSIS — Z86718 Personal history of other venous thrombosis and embolism: Secondary | ICD-10-CM

## 2023-02-27 DIAGNOSIS — I10 Essential (primary) hypertension: Secondary | ICD-10-CM | POA: Diagnosis not present

## 2023-02-27 MED ORDER — METOPROLOL TARTRATE 25 MG PO TABS: ORAL_TABLET | ORAL | 3 refills | Status: DC

## 2023-02-27 NOTE — Progress Notes (Signed)
 Patient ID: Pedro Kent, male   DOB: [birth year], 83 y.o.   MRN: [id number]      Primary MD: Dr. Charlie Kent  HPI: Pedro Kent is a 83 y.o. male who presents for an 8 month follow-up cardiology evaluation.  Pedro Kent developed a  deep vein thrombosis of his left lower extremity in January 2007and has been on chronic anticoagulation therapy with Coumadin  ever since. Additional problems include hypertension with adrenal hyperplasia, history of prostate CA treated with history of radiation seeds,  hyperlipidemia, and an abnormal ECG with T wave inversion. An echo Doppler study in January 2011  showed mild concentric LVH, mild MR with possible torn redundant chordae tendinea, mild aortic valve sclerosis, and mild coronary insufficiency.   On 09/23/2012 an echo Doppler study showed an ejection fraction at 60-65%. He had mild left ventricular hypertrophy and had normal diastolic parameters on this present study. There is very minimal pulmonary pressure elevation at 31 mm. Is mitral valve is mildly thickened with no significant regurgitation.  When I saw him he was on a multiple drug drug regimen for hypertension, consisting of amlodipine  10 mg, metoprolol  50 mg in the am and 25 mg in the pm, spironolactone  25 mg daily and valsartan  320 mg.   his blood pressure was elevated and I recommended that he increase spironolactone  and take 25 mg in the morning and 12.5 mg at night.  Ifblood pressure continued to be elevated, then he will increase this to 25 mg twice a day.  Over the past several months, he states his blood pressure has improved.  He will be establishing new primary care with Pedro Kent.  He has seen Pedro Kent for his prostate CA.  He continues to take Prilosec for GERD but this has been stable and he takes rarely.  He is taking atorvastatin  10 mg for hyperlipidemia.  He is on chronic warfarin therapy with his clot history.  He denies recent bleeding.  He continues to smoke several small  cigars per day.  He denies any chest pain, PND, orthopnea.   I saw him in March 2020 and since his prior evaluation in September 2018 he had some issues with vertigo.  He  was evaluated in the emergency room was not felt to have a stroke.  He had an MRI that was negative for an acute stroke.  Was able to walk without difficulty.  Due to symptoms from his peripheral vertigo he was instructed on the Epley maneuver.  He was also reevaluated in the emergency room setting for depression.  He has seen Pedro Kent in Pedro Kent absence and he has had some medication adjustment eluding slight reduction in his beta-blocker therapy.  He now has been taking amlodipine  10 mg, metoprolol  25 mg twice a day, and has been taking spironolactone  37-1/2 mg in the morning in addition to valsartan  320 mg daily.  He is no longer on warfarin but is now on Eliquis  for anticoagulation.  He continues to be on atorvastatin  for hyperlipidemia.  He has history of prostate CA which she is seen Pedro Kent.  He continued to be on Flonase.  His GERD was controlled with Prilosec.  I last saw him on August 03, 2020.  Since his prior evaluation he denies any chest pain, shortness of breath, or palpitations.  He has experienced some fatigue.  He was recently started on some Zyrtec for head congestion.  His blood pressure has been stable at home.  He denies any significant  leg swelling.  He continues to be on atorvastatin  10 mg, metoprolol  tartrate 25 mg in the morning and 50 mg in the evening, valsartan  320 mg daily.  He was no longer on spironolactone .  He is on Eliquis  for anticoagulation 5 mg twice daily.  He is now followed by Dr. Lonell Kent since Pedro Kent has retired.    He was last evaluated by Pedro Kent, Pedro Kent on January 18, 2022.  Prior to that evaluation, he had been evaluated in clinic with fatigue and his dose of metoprolol  had been reduced.  Subsequently on evaluation in October 2023 he was started on chlorthalidone  due to elevated  blood pressure.  An echo Doppler study on January 15, 2022 showed EF at 60 to 65% without wall motion abnormalities.  There was moderate LVH, grade 2 diastolic dysfunction, mild MR, and mild dilatation of aortic root at 41 mm.  On carotid duplex imaging on January 15, 2022 had bilateral 1 to 39% stenoses.  I last saw him on Jun 25, 2022 at which time he felt well and denied any chest pain.  He is on a medical regimen for hypertension with amlodipine  10 mg, metoprolol  succinate 25 mg, and valsartan  320 mg daily but takes 160 mg in the morning and 160 mg in the evening.  He also has been on atorvastatin  10 mg.  At times he has experienced some dizziness and fatigue.  He was concerned if he needed to take these medications.  He denies any chest tightness or pressure.  His heart rate had increased to resting heart rate in the 90s with increased heart rate with activity.  I suggested perhaps trying to take metoprolol  25 mg twice a day depending up and heart rate and blood pressure response.  Since I last saw him, he was evaluated by Pedro Kent, Pedro Kent on September 28, 2022 and Pedro Kent, Pedro Kent on December 03, 2022.  His blood pressure was stable.  At that time he was taking metoprolol  12.5 mg in the morning and 25 mg at bedtime in addition to chlorthalidone  12.5 mg, olmesartan  40 mg and amlodipine  10 mg  Presently, he has felt well.  He denies any chest pain.  He continues to be anticoagulated with Eliquis  5 mg twice a day.  He is on amlodipine  5 mg, metoprolol  succinate 12.5 mg in the morning and 25 mg at night, and olmesartan  40 mg for blood pressure control.  He takes a silodosin 8 mg daily for his prostate.  He is unaware of any significant heart rate irregularity.  He presents for evaluation.  He  Past Medical History:  Diagnosis Date   Adrenal hyperplasia (HCC)    Cancer (HCC) 2010   prostate cancer   DVT (deep venous thrombosis) (HCC)    Hyperlipidemia    Hypertension     Past Surgical History:   Procedure Laterality Date   2-D echocardiogram  03/10/2009   Normal left ventricular systolic function. Mild MR. Normal RV systolic pressure. Mild pulmonic valvular regurgitation.   Cardiac stress test  04/05/2010   Negative for ischemia    Allergies  Allergen Reactions   Ace Inhibitors Other (See Comments)    Unknown    Current Outpatient Medications  Medication Sig Dispense Refill   atorvastatin  (LIPITOR) 10 MG tablet TAKE 1 TABLET DAILY. PLEASE HAVE PATIENT GIVE OUR OFFICE A CALL FOR YEARLY APPT. 90 tablet 3   chlorthalidone  (HYGROTON ) 25 MG tablet Take 0.5 tablets (12.5 mg total) by mouth daily. 45 tablet  3   ELIQUIS  5 MG TABS tablet TAKE 1 TABLET BY MOUTH TWICE A DAY 60 tablet 5   Green Tea, Camillia sinensis, (GREEN TEA PO) Take by mouth. Cup of Tea daily.     MELATONIN GUMMIES PO Take 5 mg by mouth daily.     metoprolol  tartrate (LOPRESSOR ) 25 MG tablet Take one whole tablet in the morning and one half tablet at bedtime 180 tablet 3   Multiple Vitamins-Minerals (MULTIVITAMIN) tablet Take 1 tablet by mouth daily.     Nutritional Supplements (ENSURE COMPLETE PO) Take by mouth daily.     olmesartan  (BENICAR ) 40 MG tablet Take 1 tablet (40 mg total) by mouth daily. 90 tablet 3   silodosin (RAPAFLO) 8 MG CAPS capsule Take 8 mg by mouth daily.     amLODipine  (NORVASC ) 5 MG tablet Take 1 tablet (5 mg total) by mouth daily. 90 tablet 3   No current facility-administered medications for this visit.    Social History   Socioeconomic History   Marital status: Divorced    Spouse name: Not on file   Number of children: Not on file   Years of education: Not on file   Highest education level: Not on file  Occupational History   Not on file  Tobacco Use   Smoking status: Every Day    Current packs/day: 0.25    Types: Cigars, Cigarettes   Smokeless tobacco: Never  Substance and Sexual Activity   Alcohol use: Yes    Comment: rarely   Drug use: Not on file   Sexual activity:  Not on file  Other Topics Concern   Not on file  Social History Narrative   Not on file   Social Drivers of Health   Financial Resource Strain: Not on file  Food Insecurity: Not on file  Transportation Needs: Not on file  Physical Activity: Not on file  Stress: Not on file  Social Connections: Not on file  Intimate Partner Violence: Not on file   Socially he has 2 children one grandchild. He does walk. He denies recent alcohol use.  History reviewed. No pertinent family history.   ROS General: Negative; No fevers, chills, or night sweats; fatigue HEENT: Negative; No changes in vision or hearing, sinus congestion, difficulty swallowing Pulmonary: Negative; No cough, wheezing, shortness of breath, hemoptysis Cardiovascular: See history of present illness GI: positive for GERD; No nausea, vomiting, diarrhea, or abdominal pain GU: History of prostate CA Musculoskeletal: Negative; no myalgias, joint pain, or weakness Hematologic/Oncology: Negative; no easy bruising, bleeding Endocrine: Negative; no heat/cold intolerance; no diabetes Neuro:vertigo Skin: Negative; No rashes or skin lesions Psychiatric: Pression Sleep: Negative; No snoring, daytime sleepiness, hypersomnolence, bruxism, restless legs, hypnogognic hallucinations, no cataplexy Other comprehensive 14 point system review is negative.    PE BP 126/86   Pulse 79   Ht 6' (1.829 m)   Wt 166 lb 6.4 oz (75.5 kg)   SpO2 97%   BMI 22.57 kg/m     Repeat blood pressure by me was 128/84  Wt Readings from Last 3 Encounters:  02/27/23 166 lb 6.4 oz (75.5 kg)  12/03/22 205 lb (93 kg)  09/28/22 202 lb (91.6 kg)   General: Alert, oriented, no distress.  Skin: normal turgor, no rashes, warm and dry HEENT: Normocephalic, atraumatic. Pupils equal round and reactive to light; sclera anicteric; extraocular muscles intact;  Nose without nasal septal hypertrophy Mouth/Parynx benign; Mallinpatti scale 3 Neck: No JVD, no  carotid bruits; normal carotid upstroke Lungs: clear  to ausculatation and percussion; no wheezing or rales Chest wall: without tenderness to palpitation Heart: PMI not displaced, RRR, s1 s2 normal, 1/6 systolic murmur, no diastolic murmur, no rubs, gallops, thrills, or heaves Abdomen: soft, nontender; no hepatosplenomehaly, BS+; abdominal aorta nontender and not dilated by palpation. Back: no CVA tenderness Pulses 2+ Musculoskeletal: full range of motion, normal strength, no joint deformities Extremities: no clubbing cyanosis or edema, Homan's sign negative  Neurologic: grossly nonfocal; Cranial nerves grossly wnl Psychologic: Normal mood and affect     EKG Interpretation Date/Time:  Wednesday February 27 2023 11:11:47 EST Ventricular Rate:  79 PR Interval:  160 QRS Duration:  74 QT Interval:  332 QTC Calculation: 380 R Axis:   6  Text Interpretation: Normal sinus rhythm with sinus arrhythmia Nonspecific ST and T wave abnormality When compared with ECG of 03-Sep-2017 13:48, Criteria for Anteroseptal infarct are no longer Present T wave inversion now evident in Lateral leads Confirmed by Burnard Ned ([id number]) on 02/27/2023 11:34:57 AM      Jun 25, 2022 ECG (independently read by me): NSR at 90, NSSTT changes  June 15, 20222 ECG (independently read by me): NSR at 74, sinus arrhythmia; PRWP V1-23 April 2018 ECG (independently read by me): Sinus rhythm at 95 bpm with occasional PAC.  Poor anterior R wave progression.  Lateral T wave abnormality.  September 2018 ECG (independently read by me): Sinus tachycardia 59 bpm.  Poor progression V1 through V3.  Normal intervals.  No ectopy.  March 2018 ECG (independently read by me): Normal sinus rhythm at 61 bpm with mild sinus arrhythmia.  Poor R wave progression.  Previously noted inferolateral T changes.  May 2017 ECG (independently read by me): Normal sinus rhythm with occasional PACs.  Previously noted inferolateral T wave abnormality.  QTc  interval 410 ms.  December 2016 ECG (independently read by me):  Normal sinus rhythm with an occasional PAC. Previously noted inferolateral T wave inversion  November 2015 ECG (independently read by me): Normal sinus rhythm at 62 bpm.  Poor R-wave progression previously noted T-wave inversion inferolaterally  Prior September 2014 ECG: Sinus rhythm at 57 beats a minute. Previously noted T-wave abnormalities inferolaterally.  LABS:    Latest Ref Rng & Units 06/25/2022    2:18 PM 01/10/2022    1:15 PM 12/26/2021    2:48 PM  BMP  Glucose 70 - 99 mg/dL 86  866  895   BUN 8 - 27 mg/dL 19  15  31    Creatinine 0.76 - 1.27 mg/dL 8.77  8.84  8.55   BUN/Creat Ratio 10 - 24 16  13  22    Sodium 134 - 144 mmol/L 134  135  140   Potassium 3.5 - 5.2 mmol/L 4.9  4.0  4.4   Chloride 96 - 106 mmol/L 95  97  98   CO2 20 - 29 mmol/L 26  25  25    Calcium  8.6 - 10.2 mg/dL 89.9  9.3  9.7       Latest Ref Rng & Units 06/25/2022    2:18 PM 12/26/2021    2:48 PM 11/09/2021    3:20 PM  Hepatic Function  Total Protein 6.0 - 8.5 g/dL 7.3  7.1  7.1   Albumin 3.7 - 4.7 g/dL 4.5  4.5  4.5   AST 0 - 40 IU/L 32  24  20   ALT 0 - 44 IU/L 34  28  27   Alk Phosphatase 44 - 121 IU/L 86  96  94   Total Bilirubin 0.0 - 1.2 mg/dL 0.7  0.6  0.6       Latest Ref Rng & Units 06/25/2022    2:18 PM 11/09/2021    3:20 PM 02/09/2020   12:37 PM  CBC  WBC 3.4 - 10.8 x10E3/uL 5.6  6.1  4.8   Hemoglobin 13.0 - 17.7 g/dL 85.0  85.1  86.0   Hematocrit 37.5 - 51.0 % 42.3  42.8  40.2   Platelets 150 - 450 x10E3/uL 210  172  187    Lab Results  Component Value Date   MCV 100 (H) 06/25/2022   MCV 101 (H) 11/09/2021   MCV 102 (H) 02/09/2020   Lab Results  Component Value Date   TSH 0.772 06/25/2022   Lab Results  Component Value Date   HGBA1C 5.8 (H) 11/09/2021     Lipid Panel     Component Value Date/Time   CHOL 133 06/25/2022 1418   TRIG 53 06/25/2022 1418   HDL 61 06/25/2022 1418   CHOLHDL 2.2 06/25/2022 1418    CHOLHDL 2.9 05/16/2016 1127   VLDL 14 05/16/2016 1127   LDLCALC 60 06/25/2022 1418   LDLDIRECT 55 12/26/2021 1448   IMPRESSION:  1. Essential hypertension   2. Pure hypercholesterolemia   3. History of DVT (deep vein thrombosis)   4. Hypercoagulable state (HCC)   5. Bilateral carotid artery stenosis   6. History of prostate cancer    ASSESSMENT AND PLAN: Mr. Kats is an 83 year-old African-American gentleman who is 18 years s/p developing his deep vein thrombosis in January 2007.  He initially was on chronic Coumadin  therapy which subsequently was switched to Eliquis   5 mg twice a day.  He has had significant issues with hypertension and had required 4 drug regimen.  Medications now include amlodipine  5 mg, chlorthalidone  12.5 mg, olmesartan  40 mg and metoprolol  succinate which he takes 12.5 mg in the morning and 25 mg at night.  Blood pressure today is relatively stable with diastolics in the mid 80s.  Resting pulse also is in the upper 70s to 80s.  I have recommended he switch how he takes metoprolol  succinate and recommended he take 25 mg in the morning and take the reduced dose 12.5 mg at night.  His ECG remained stable and demonstrates sinus rhythm with sinus arrhythmia and nonspecific ST changes.  He continues to be on atorvastatin  10 mg for hyperlipidemia.  Laboratory in May 2024 showed total cholesterol 133, LDL 60, triglycerides 53, and HDL 61.  He plans to reestablish with a new primary care provider.  He continues to be followed by urology with his history of prostate CVA and is on silodosin 8 mg daily.  Clinically he is doing fairly well.  Since he had last seen Pedro Kent, I have suggested that in 6 months he have a follow-up evaluation with Pedro.  I discussed my plans for retirement this year and following her evaluation he will be transitioned to one of my colleagues.   Debby RONAL Sor, MD, FACC  03/01/2023 1:51 PM

## 2023-02-27 NOTE — Patient Instructions (Addendum)
 Medication Instructions:  Take the Metoprolol  Tartrate 25mg . Take one whole tablet in the morning (25mg ). Take one half tablet at bedtime (12.5mg )   *If you need a refill on your cardiac medications before your next appointment, please call your pharmacy*   Lab Work: No labs were ordered during today's visit.    If you have labs (blood work) drawn today and your tests are completely normal, you will receive your results only by: MyChart Message (if you have MyChart) OR A paper copy in the mail If you have any lab test that is abnormal or we need to change your treatment, we will call you to review the results.   Testing/Procedures: No procedures ordered today.    Follow-Up: At [facility], you and your health needs are our priority.  As part of our continuing mission to provide you with exceptional heart care, we have created designated Provider Care Teams.  These Care Teams include your primary Cardiologist (physician) and Advanced Practice Providers (APPs -  Physician Assistants and Nurse Practitioners) who all work together to provide you with the care you need, when you need it.  We recommend signing up for the patient portal called MyChart.  Sign up information is provided on this After Visit Summary.  MyChart is used to connect with patients for Virtual Visits (Telemedicine).  Patients are able to view lab/test results, encounter notes, upcoming appointments, etc.  Non-urgent messages can be sent to your provider as well.   To learn more about what you can do with MyChart, go to [email or web address].    Your next appointment:   6 month(s)  Provider:   Damien Braver    Other Instructions Thank you for choosing [facility]!

## 2023-03-01 ENCOUNTER — Ambulatory Visit: Payer: Medicare Other | Admitting: Cardiovascular Disease

## 2023-03-01 ENCOUNTER — Encounter: Payer: Self-pay | Admitting: Cardiovascular Disease

## 2023-03-15 DIAGNOSIS — N3943 Post-void dribbling: Secondary | ICD-10-CM | POA: Diagnosis not present

## 2023-03-15 DIAGNOSIS — R3912 Poor urinary stream: Secondary | ICD-10-CM | POA: Diagnosis not present

## 2023-03-15 DIAGNOSIS — R351 Nocturia: Secondary | ICD-10-CM | POA: Diagnosis not present

## 2023-03-15 DIAGNOSIS — N401 Enlarged prostate with lower urinary tract symptoms: Secondary | ICD-10-CM | POA: Diagnosis not present

## 2023-03-26 ENCOUNTER — Telehealth: Payer: Self-pay | Admitting: Cardiovascular Disease

## 2023-03-26 MED ORDER — CHLORTHALIDONE 25 MG PO TABS: 12.5000 mg | ORAL_TABLET | Freq: Every day | ORAL | 3 refills | Status: DC

## 2023-03-26 NOTE — Telephone Encounter (Signed)
 Pt's medication was sent to pt's pharmacy as requested. Confirmation received.

## 2023-03-26 NOTE — Telephone Encounter (Signed)
*  STAT* If patient is at the pharmacy, call can be transferred to refill team.   1. Which medications need to be refilled? (please list name of each medication and dose if known)  chlorthalidone  (HYGROTON ) 25 MG tablet  2. Which pharmacy/location (including street and city if local pharmacy) is medication to be sent to? [id number] - [place], [place] - [address]    3. Do they need a 30 day or 90 day supply?  90 day supply  Patient is completely out of medication.

## 2023-04-25 ENCOUNTER — Other Ambulatory Visit: Payer: Self-pay | Admitting: Cardiovascular Disease

## 2023-07-22 ENCOUNTER — Other Ambulatory Visit: Payer: Self-pay | Admitting: Nurse Practitioner

## 2023-08-26 ENCOUNTER — Other Ambulatory Visit: Payer: Self-pay

## 2023-08-26 MED ORDER — OLMESARTAN MEDOXOMIL 40 MG PO TABS: 40.0000 mg | ORAL_TABLET | Freq: Every day | ORAL | 1 refills | Status: AC

## 2023-09-02 DIAGNOSIS — Z8546 Personal history of malignant neoplasm of prostate: Secondary | ICD-10-CM | POA: Diagnosis not present

## 2023-09-02 DIAGNOSIS — N401 Enlarged prostate with lower urinary tract symptoms: Secondary | ICD-10-CM | POA: Diagnosis not present

## 2023-09-02 DIAGNOSIS — R3912 Poor urinary stream: Secondary | ICD-10-CM | POA: Diagnosis not present

## 2023-10-29 ENCOUNTER — Telehealth: Payer: Self-pay | Admitting: Cardiovascular Disease

## 2023-10-29 DIAGNOSIS — Z86718 Personal history of other venous thrombosis and embolism: Secondary | ICD-10-CM

## 2023-10-29 MED ORDER — APIXABAN 5 MG PO TABS: 5.0000 mg | ORAL_TABLET | Freq: Two times a day (BID) | ORAL | 0 refills | Status: DC

## 2023-10-29 NOTE — Telephone Encounter (Signed)
 Returned call to the patient and he states he will be able to come get labs on a Monday, Wednesday, or Friday next week. He states he still has not found a Contractor. Advised that will order labs now and [facility] and he was thankful.  He states he will come to have labs drawn on 11/06/23 with his Caregiver, gave him the [address] address.   Advised patient that he does need to get a PCP as well and he states he is looking and needs to find the right one. Advised we have some with [facility] and he will look into it with his caregiver.  Labs ordered and released together.

## 2023-10-29 NOTE — Telephone Encounter (Signed)
 Eliquis  5mg  refill request received. Patient is 83 years old, weight-75.5kg, Crea-1.22 on 06/25/22-need labs , Diagnosis-dvt, and last seen by Dr. Burnard on 02/27/23. Dose is appropriate based on dosing criteria.   Pt needs updated labs. Called patients PCP Dr. Gerome (per last ov note) to inquire if any labs done within the last year) had to leave a message for O[facility] with their medical records. I left a voicemail with our fax and phone number.

## 2023-10-29 NOTE — Telephone Encounter (Signed)
*  STAT* If patient is at the pharmacy, call can be transferred to refill team.   1. Which medications need to be refilled? (please list name of each medication and dose if known)    ELIQUIS  5 MG TABS tablet    4. Which pharmacy/location (including street and city if local pharmacy) is medication to be sent to?  [id number] - [place], [place] - [address]     5. Do they need a 30 day or 90 day supply? 90    Scheduled 11/17

## 2023-10-29 NOTE — Telephone Encounter (Signed)
 Pt called in asking for a refill again, informed him they are waiting to hear back from Dr. Gerome about labs. He states he hasn't seen her in years and requested a c/b from nurse about refill.

## 2023-11-07 LAB — BASIC METABOLIC PANEL WITH GFR
BUN/Creatinine Ratio: 14 (ref 10–24)
BUN: 16 mg/dL (ref 8–27)
CO2: 24 mmol/L (ref 20–29)
Calcium: 9.7 mg/dL (ref 8.6–10.2)
Chloride: 94 mmol/L — AB (ref 96–106)
Creatinine, Ser: 1.11 mg/dL (ref 0.76–1.27)
Glucose: 115 mg/dL — AB (ref 70–99)
Potassium: 4.1 mmol/L (ref 3.5–5.2)
Sodium: 132 mmol/L — AB (ref 134–144)
eGFR: 65 mL/min/1.73 (ref >59)

## 2023-11-07 LAB — CBC
Hematocrit: 40.3 % (ref 37.5–51.0)
Hemoglobin: 13.9 g/dL (ref 13.0–17.7)
MCH: 35.9 pg — ABNORMAL HIGH (ref 26.6–33.0)
MCHC: 34.5 g/dL (ref 31.5–35.7)
MCV: 104 fL — ABNORMAL HIGH (ref 79–97)
Platelets: 193 x10E3/uL (ref 150–450)
RBC: 3.87 x10E6/uL — ABNORMAL LOW (ref 4.14–5.80)
RDW: 12.8 % (ref 11.6–15.4)
WBC: 4.9 x10E3/uL (ref 3.4–10.8)

## 2023-11-08 ENCOUNTER — Ambulatory Visit: Payer: Self-pay | Admitting: Nurse Practitioner

## 2023-11-11 ENCOUNTER — Other Ambulatory Visit: Payer: Self-pay

## 2023-11-11 DIAGNOSIS — E876 Hypokalemia: Secondary | ICD-10-CM

## 2023-11-12 ENCOUNTER — Other Ambulatory Visit: Payer: Self-pay | Admitting: *Deleted

## 2023-11-12 DIAGNOSIS — Z86718 Personal history of other venous thrombosis and embolism: Secondary | ICD-10-CM

## 2023-11-12 MED ORDER — APIXABAN 5 MG PO TABS: 5.0000 mg | ORAL_TABLET | Freq: Two times a day (BID) | ORAL | 5 refills | Status: AC

## 2023-11-12 NOTE — Telephone Encounter (Signed)
 Eliquis  5mg  refill request received. Patient is 83 years old, weight-75.5kg, Crea-1.11 on 11/06/23, Diagnosis-DVT, and last seen by Dr. Burnard on 02/27/23 and ahs follow up appt with Damien Braver 01/06/24. Dose is appropriate based on dosing criteria. Will send in refill to requested pharmacy.

## 2023-11-13 ENCOUNTER — Telehealth: Payer: Self-pay | Admitting: Nurse Practitioner

## 2023-11-13 NOTE — Telephone Encounter (Signed)
 Pt is requesting most recent lab results to be sent through mail to his address.

## 2023-11-13 NOTE — Telephone Encounter (Signed)
 Spoke with pt, confirmed address and labs placed in the mail

## 2023-12-19 DIAGNOSIS — E876 Hypokalemia: Secondary | ICD-10-CM | POA: Diagnosis not present

## 2023-12-20 LAB — BASIC METABOLIC PANEL WITH GFR
BUN/Creatinine Ratio: 10 (ref 10–24)
BUN: 11 mg/dL (ref 8–27)
CO2: 23 mmol/L (ref 20–29)
Calcium: 9.7 mg/dL (ref 8.6–10.2)
Chloride: 98 mmol/L (ref 96–106)
Creatinine, Ser: 1.12 mg/dL (ref 0.76–1.27)
Glucose: 88 mg/dL (ref 70–99)
Potassium: 4.1 mmol/L (ref 3.5–5.2)
Sodium: 135 mmol/L (ref 134–144)
eGFR: 64 mL/min/1.73 (ref >59)

## 2023-12-25 ENCOUNTER — Ambulatory Visit: Payer: Self-pay | Admitting: Nurse Practitioner

## 2024-01-06 ENCOUNTER — Encounter: Payer: Self-pay | Admitting: Nurse Practitioner

## 2024-01-06 ENCOUNTER — Ambulatory Visit: Attending: Nurse Practitioner | Admitting: Nurse Practitioner

## 2024-01-06 VITALS — BP 148/92 | HR 79 | Ht 72.0 in | Wt 208.0 lb

## 2024-01-06 DIAGNOSIS — G4730 Sleep apnea, unspecified: Secondary | ICD-10-CM

## 2024-01-06 DIAGNOSIS — I10 Essential (primary) hypertension: Secondary | ICD-10-CM | POA: Diagnosis not present

## 2024-01-06 DIAGNOSIS — Z86718 Personal history of other venous thrombosis and embolism: Secondary | ICD-10-CM

## 2024-01-06 DIAGNOSIS — I6523 Occlusion and stenosis of bilateral carotid arteries: Secondary | ICD-10-CM

## 2024-01-06 DIAGNOSIS — E785 Hyperlipidemia, unspecified: Secondary | ICD-10-CM | POA: Diagnosis not present

## 2024-01-06 DIAGNOSIS — Z136 Encounter for screening for cardiovascular disorders: Secondary | ICD-10-CM

## 2024-01-06 DIAGNOSIS — I77810 Thoracic aortic ectasia: Secondary | ICD-10-CM

## 2024-01-06 MED ORDER — CARVEDILOL 6.25 MG PO TABS: 6.2500 mg | ORAL_TABLET | Freq: Two times a day (BID) | ORAL | 3 refills | Status: AC

## 2024-01-06 NOTE — Progress Notes (Signed)
 Office Visit    Patient Name: Pedro Kent Date of Encounter: 01/06/2024  Primary Care Provider:  Patient, No Pcp Per Primary Cardiologist:  Alm Clay, MD  Chief Complaint    83 year old male with a history of hypertension, hyperlipidemia, carotid artery stenosis, aortic root dilation, DVT, prostate cancer s/p radiation seed, and adrenal hyperplasia who presents for follow-up related to hypertension, dizziness and fatigue.    Past Medical History    Past Medical History:  Diagnosis Date   Adrenal hyperplasia    Cancer (HCC) 2010   prostate cancer   DVT (deep venous thrombosis) (HCC)    Hyperlipidemia    Hypertension    Past Surgical History:  Procedure Laterality Date   2-D echocardiogram  03/10/2009   Normal left ventricular systolic function. Mild MR. Normal RV systolic pressure. Mild pulmonic valvular regurgitation.   Cardiac stress test  04/05/2010   Negative for ischemia    Allergies  Allergies  Allergen Reactions   Ace Inhibitors Other (See Comments)    Unknown     Labs/Other Studies Reviewed    The following studies were reviewed today:  Cardiac Studies & Procedures   ______________________________________________________________________________________________   STRESS TESTS  NM MYOCAR MULTI W/SPECT W 04/05/2010   ECHOCARDIOGRAM  ECHOCARDIOGRAM COMPLETE 01/15/2022  Narrative ECHOCARDIOGRAM REPORT    Patient Name:   Pedro Kent Date of Exam: 01/15/2022 Medical Rec #:  [id number]       Height:       72.0 in Accession #:    [id number]      Weight:       192.9 lb Date of Birth:  1940-05-16       BSA:          2.098 m Patient Age:    81 years        BP:           151/84 mmHg Patient Gender: M               HR:           84 bpm. Exam Location:  Outpatient  Procedure: 2D Echo, 3D Echo, Cardiac Doppler and Color Doppler  Indications:    R53.83 Fatigue; R42 Lightheaded  History:        Patient has prior history of Echocardiogram  examinations, most recent 09/23/2012. Signs/Symptoms:Dizziness/Lightheadedness; Risk Factors:Hypertension, Dyslipidemia and Current Smoker. Patient denies chest pain, SOB or leg edema. Patient complains of overall fatigue, loss of stamina and lightheadedness with vertigo.  Sonographer:    Annabella Cater RVT, RDCS (AE), RDMS Referring Phys: [phone] [facility]   Sonographer Comments: Suboptimal apical window and suboptimal subcostal window. Image acquisition challenging due to respiratory motion. IMPRESSIONS   1. Left ventricular ejection fraction, by estimation, is 60 to 65%. The left ventricle has normal function. The left ventricle has no regional wall motion abnormalities. There is moderate asymmetric left ventricular hypertrophy of the basal-septal segment. Left ventricular diastolic parameters are consistent with Grade II diastolic dysfunction (pseudonormalization). Elevated left atrial pressure. 2. Right ventricular systolic function is normal. The right ventricular size is normal. 3. Left atrial size was moderately dilated. 4. The mitral valve is abnormal. Mild mitral valve regurgitation. No evidence of mitral stenosis. Severe mitral annular calcification. 5. The aortic valve is tricuspid. Aortic valve regurgitation is not visualized. Aortic valve sclerosis/calcification is present, without any evidence of aortic stenosis. 6. Aortic dilatation noted. There is dilatation of the aortic root, measuring 41 mm.  Comparison(s): EF 60%,  MAC, mild LVH.  FINDINGS Left Ventricle: Left ventricular ejection fraction, by estimation, is 60 to 65%. The left ventricle has normal function. The left ventricle has no regional wall motion abnormalities. The left ventricular internal cavity size was normal in size. There is moderate asymmetric left ventricular hypertrophy of the basal-septal segment. Left ventricular diastolic parameters are consistent with Grade II diastolic dysfunction  (pseudonormalization). Elevated left atrial pressure.  Right Ventricle: The right ventricular size is normal. No increase in right ventricular wall thickness. Right ventricular systolic function is normal.  Left Atrium: Left atrial size was moderately dilated.  Right Atrium: Right atrial size was normal in size.  Pericardium: There is no evidence of pericardial effusion.  Mitral Valve: The mitral valve is abnormal. Severe mitral annular calcification. Mild mitral valve regurgitation. No evidence of mitral valve stenosis.  Tricuspid Valve: The tricuspid valve is normal in structure. Tricuspid valve regurgitation is mild.  Aortic Valve: The aortic valve is tricuspid. Aortic valve regurgitation is not visualized. Aortic valve sclerosis/calcification is present, without any evidence of aortic stenosis. Aortic valve mean gradient measures 4.0 mmHg. Aortic valve peak gradient measures 8.3 mmHg. Aortic valve area, by VTI measures 2.36 cm.  Pulmonic Valve: The pulmonic valve was not well visualized. Pulmonic valve regurgitation is mild.  Aorta: Aortic dilatation noted. There is dilatation of the aortic root, measuring 41 mm.  IAS/Shunts: The interatrial septum was not well visualized.   LEFT VENTRICLE PLAX 2D LVIDd:         3.55 cm     Diastology LVIDs:         2.25 cm     LV e' medial:    5.55 cm/s LV PW:         1.00 cm     LV E/e' medial:  16.3 LV IVS:        1.38 cm     LV e' lateral:   6.20 cm/s LVOT diam:     2.30 cm     LV E/e' lateral: 14.6 LV SV:         78 LV SV Index:   37 LVOT Area:     4.15 cm  3D Volume EF: LV Volumes (MOD)           3D EF:        59 % LV vol d, MOD A2C: 49.0 ml LV EDV:       88 ml LV vol d, MOD A4C: 82.4 ml LV ESV:       36 ml LV vol s, MOD A2C: 12.5 ml LV SV:        52 ml LV vol s, MOD A4C: 17.9 ml LV SV MOD A2C:     36.5 ml LV SV MOD A4C:     82.4 ml LV SV MOD BP:      48.6 ml  RIGHT VENTRICLE RV S prime:     12.20 cm/s TAPSE (M-mode): 1.8  cm  LEFT ATRIUM              Index        RIGHT ATRIUM           Index LA diam:        3.70 cm  1.76 cm/m   RA Area:     14.60 cm LA Vol (A2C):   98.2 ml  46.80 ml/m  RA Volume:   36.60 ml  17.44 ml/m LA Vol (A4C):   87.4 ml  41.66 ml/m LA Biplane  Vol: 100.0 ml 47.66 ml/m AORTIC VALVE                     PULMONIC VALVE AV Area (Vmax):    2.42 cm      PV Vmax:          0.86 m/s AV Area (Vmean):   2.11 cm      PV Peak grad:     3.0 mmHg AV Area (VTI):     2.36 cm      PR End Diast Vel: 3.88 msec AV Vmax:           144.00 cm/s AV Vmean:          101.000 cm/s AV VTI:            0.329 m AV Peak Grad:      8.3 mmHg AV Mean Grad:      4.0 mmHg LVOT Vmax:         83.80 cm/s LVOT Vmean:        51.200 cm/s LVOT VTI:          0.187 m LVOT/AV VTI ratio: 0.57  AORTA Ao Root diam: 4.10 cm Ao Asc diam:  3.70 cm Ao Arch diam: 3.8 cm  MITRAL VALVE                TRICUSPID VALVE MV Area (PHT): 3.08 cm     TR Peak grad:   32.9 mmHg MV Decel Time: 246 msec     TR Vmax:        287.00 cm/s MV E velocity: 90.70 cm/s MV A velocity: 117.00 cm/s  SHUNTS MV E/A ratio:  0.78         Systemic VTI:  0.19 m Systemic Diam: 2.30 cm  Lonni Nanas MD Electronically signed by Lonni Nanas MD Signature Date/Time: 01/16/2022/10:39:05 AM    Final          ______________________________________________________________________________________________     Recent Labs: 11/06/2023: Hemoglobin 13.9; Platelets 193 12/19/2023: BUN 11; Creatinine, Ser 1.12; Potassium 4.1; Sodium 135  Recent Lipid Panel    Component Value Date/Time   CHOL 133 06/25/2022 1418   TRIG 53 06/25/2022 1418   HDL 61 06/25/2022 1418   CHOLHDL 2.2 06/25/2022 1418   CHOLHDL 2.9 05/16/2016 1127   VLDL 14 05/16/2016 1127   LDLCALC 60 06/25/2022 1418   LDLDIRECT 55 12/26/2021 1448    History of Present Illness    83 year old male with the above past medical history including hypertension,  hyperlipidemia, carotid artery stenosis, aortic root dilation, DVT, prostate cancer s/p radiation seed, and adrenal hyperplasia.   He is on Eliquis  for history of prior DVT.  He has followed with Dr. Burnard primarily for the management of hypertension.  He has required titration of his medication regimen in the setting of ongoing elevated BP, concern for side effects.  Echocardiogram in November 2023 showed EF 60 to 65%, moderate LVH, G2 DD, mild MR, mild dilation of aortic root measuring 41 mm.  Carotid ultrasound in November 2023 showed 1 to 39% bilateral ICA stenoses.  At his follow-up visit in 09/2022 he reported mild dizziness and fatigue, labile BP.  He felt that his symptoms were likely related to tamsulosin .  He declined sleep study, echocardiogram, or stress testing.  Resting heart rate was in the 90s.  He was advised to increase his metoprolol  to 25 mg twice daily.  He was last seen in the office on 02/27/2023 and was stable  from a cardiac standpoint.  Chlorthalidone  was discontinued in the setting of hyponatremia.   He presents today for follow-up accompanied by his niece.  Since his last visit he has  been stable from a cardiac standpoint.  He shares that while he was taking chlorthalidone  he noticed increased weakness, fatigue, gait instability, dizziness, and joint pain.  He stopped taking the medication and his symptoms improved.  His BP has been elevated with SBP in the 160s.  He denies symptoms concerning for angina.  He is looking for a new PCP, list provided in office today.  Home Medications    Current Outpatient Medications  Medication Sig Dispense Refill   amLODipine  (NORVASC ) 5 MG tablet Take 1 tablet (5 mg total) by mouth daily. 90 tablet 2   apixaban  (ELIQUIS ) 5 MG TABS tablet Take 1 tablet (5 mg total) by mouth 2 (two) times daily. 60 tablet 5   atorvastatin  (LIPITOR) 10 MG tablet Take 1 tablet (10 mg total) by mouth daily. 90 tablet 3   carvedilol (COREG) 6.25 MG tablet Take 1  tablet (6.25 mg total) by mouth 2 (two) times daily. 180 tablet 3   Green Tea, Camillia sinensis, (GREEN TEA PO) Take by mouth. Cup of Tea daily.     MELATONIN GUMMIES PO Take 5 mg by mouth daily.     Multiple Vitamins-Minerals (MULTIVITAMIN) tablet Take 1 tablet by mouth daily.     Nutritional Supplements (ENSURE COMPLETE PO) Take by mouth daily.     olmesartan  (BENICAR ) 40 MG tablet Take 1 tablet (40 mg total) by mouth daily. 90 tablet 1   silodosin (RAPAFLO) 8 MG CAPS capsule Take 8 mg by mouth daily.     No current facility-administered medications for this visit.     Review of Systems    He denies chest pain, palpitations, dyspnea, pnd, orthopnea, n, v, syncope, edema, weight gain, or early satiety. All other systems reviewed and are otherwise negative except as noted above.   Physical Exam    VS:  BP (!) 148/92   Pulse 79   Ht 6' (1.829 m)   Wt 208 lb (94.3 kg)   SpO2 95%   BMI 28.21 kg/m  STOP-Bang Score:         GEN: Well nourished, well developed, in no acute distress. HEENT: normal. Neck: Supple, no JVD, carotid bruits, or masses. Cardiac: RRR, no murmurs, rubs, or gallops. No clubbing, cyanosis, edema.  Radials/DP/PT 2+ and equal bilaterally.  Respiratory:  Respirations regular and unlabored, clear to auscultation bilaterally. GI: Soft, nontender, nondistended, BS + x 4. MS: no deformity or atrophy. Skin: warm and dry, no rash. Neuro:  Strength and sensation are intact. Psych: Normal affect.  Accessory Clinical Findings    ECG personally reviewed by me today - EKG Interpretation Date/Time:  Monday January 06 2024 13:19:55 EST Ventricular Rate:  79 PR Interval:  158 QRS Duration:  80 QT Interval:  348 QTC Calculation: 399 R Axis:   17  Text Interpretation: Sinus rhythm with occasional Premature ventricular complexes and Premature atrial complexes Septal infarct , age undetermined When compared with ECG of 27-Feb-2023 11:11, Premature ventricular complexes are  now Present Premature atrial complexes are now Present Confirmed by Daneen Perkins ([id number]) on 01/06/2024 1:27:06 PM  - no acute changes.   Lab Results  Component Value Date   WBC 4.9 11/06/2023   HGB 13.9 11/06/2023   HCT 40.3 11/06/2023   MCV 104 (H) 11/06/2023   PLT 193 11/06/2023  Lab Results  Component Value Date   CREATININE 1.12 12/19/2023   BUN 11 12/19/2023   NA 135 12/19/2023   K 4.1 12/19/2023   CL 98 12/19/2023   CO2 23 12/19/2023   Lab Results  Component Value Date   ALT 34 06/25/2022   AST 32 06/25/2022   ALKPHOS 86 06/25/2022   BILITOT 0.7 06/25/2022   Lab Results  Component Value Date   CHOL 133 06/25/2022   HDL 61 06/25/2022   LDLCALC 60 06/25/2022   LDLDIRECT 55 12/26/2021   TRIG 53 06/25/2022   CHOLHDL 2.2 06/25/2022    Lab Results  Component Value Date   HGBA1C 5.8 (H) 11/09/2021    Assessment & Plan    1. Hypertension: BP elevated in office today.  He did not tolerate chlorthalidone  due to hyponatremia, associated weakness, fatigue, gait instability, dizziness, joint pain.  Repeat to be met upon discontinuation of chlorthalidone  was stable.  Given ongoing elevated BP, will stop metoprolol  and start carvedilol 6.25 mg twice daily.  Continue to monitor BP and report BP consistently greater than 140/80 mmHg.    2. Fatigue/lightheadedness: Echocardiogram in November 2023 showed EF 60 to 65%, moderate LVH, G2 DD, mild MR, mild dilation of aortic root measuring 41 mm.  Carotid ultrasound in November 2023 showed 1 to 39% bilateral ICA stenoses. Persistent. Mildly improved with discontinuation of chlorthalidone .  We discussed repeat echocardiogram, carotid ultrasound, he declines today.  Previously declined ischemic evaluation.  Continue to monitor for progressive symptoms. Reviewed ED precautions.   3. Hyperlipidemia: LDL was 60 in 06/2022.  Due for repeat fasting lipids, consider at follow-up visit.  Continue atorvastatin .   4. History of DVT: Continue  Eliquis .  Denies bleeding.  Recent CBC, BMET stable.   5. Carotid artery stenosis: 1 to 39% bilateral carotid artery stenosis noted on carotid ultrasound in 12/2021.  He is due for repeat carotid ultrasound, we discussed this at today's visit, he declines.  Can revisit at follow-up.   6. Aortic root dilation:  Aortic root measured 41 mm on most recent echo.  Repeat echocardiogram was previously ordered but not completed.  We discussed repeat echocardiogram today, he declines.  Can revisit at follow-up.   7. Sleep disordered breathing: Declines sleep study.    5. Disposition: Follow-up in 6 to 8 weeks.  He wishes to establish with Dr. Anner.  HYPERTENSION CONTROL Vitals:   01/06/24 1316 01/06/24 1335  BP: (!) 152/80 (!) 148/92    The patient's blood pressure is elevated above target today.  In order to address the patient's elevated BP: A current anti-hypertensive medication was adjusted today.; Blood pressure will be monitored at home to determine if medication changes need to be made.; Follow up with general cardiology has been recommended.      Damien JAYSON Braver, NP 01/06/2024, 2:36 PM

## 2024-01-06 NOTE — Patient Instructions (Addendum)
 Medication Instructions:  Stop Chlorthalidone  25 mg  Stop Metoprolol  Tartrate 25 mg  Start Carvedilol 6.25 mg twice daily *If you need a refill on your cardiac medications before your next appointment, please call your pharmacy*  Lab Work: NONE ordered at this time of appointment   Testing/Procedures: NONE ordered at this time of appointment   Follow-Up: At [facility], you and your health needs are our priority.  As part of our continuing mission to provide you with exceptional heart care, our providers are all part of one team.  This team includes your primary Cardiologist (physician) and Advanced Practice Providers or APPs (Physician Assistants and Nurse Practitioners) who all work together to provide you with the care you need, when you need it.  Your next appointment:   6-8 week(s)  Provider:   Damien Braver, NP          We recommend signing up for the patient portal called MyChart.  Sign up information is provided on this After Visit Summary.  MyChart is used to connect with patients for Virtual Visits (Telemedicine).  Patients are able to view lab/test results, encounter notes, upcoming appointments, etc.  Non-urgent messages can be sent to your provider as well.   To learn more about what you can do with MyChart, go to [email or web address].   Other Instructions

## 2024-02-19 ENCOUNTER — Encounter: Payer: Self-pay | Admitting: Nurse Practitioner

## 2024-02-19 ENCOUNTER — Telehealth: Payer: Self-pay | Admitting: Nurse Practitioner

## 2024-02-19 ENCOUNTER — Ambulatory Visit: Attending: Nurse Practitioner | Admitting: Nurse Practitioner

## 2024-02-19 VITALS — BP 130/88 | HR 87 | Ht 72.0 in | Wt 206.0 lb

## 2024-02-19 DIAGNOSIS — Z79899 Other long term (current) drug therapy: Secondary | ICD-10-CM | POA: Diagnosis not present

## 2024-02-19 DIAGNOSIS — G4730 Sleep apnea, unspecified: Secondary | ICD-10-CM | POA: Diagnosis not present

## 2024-02-19 DIAGNOSIS — Z86718 Personal history of other venous thrombosis and embolism: Secondary | ICD-10-CM

## 2024-02-19 DIAGNOSIS — R5383 Other fatigue: Secondary | ICD-10-CM

## 2024-02-19 DIAGNOSIS — I10 Essential (primary) hypertension: Secondary | ICD-10-CM

## 2024-02-19 DIAGNOSIS — I77810 Thoracic aortic ectasia: Secondary | ICD-10-CM

## 2024-02-19 DIAGNOSIS — I6523 Occlusion and stenosis of bilateral carotid arteries: Secondary | ICD-10-CM

## 2024-02-19 DIAGNOSIS — E785 Hyperlipidemia, unspecified: Secondary | ICD-10-CM

## 2024-02-19 DIAGNOSIS — R42 Dizziness and giddiness: Secondary | ICD-10-CM | POA: Diagnosis not present

## 2024-02-19 NOTE — Patient Instructions (Signed)
 Medication Instructions:  Stop Atorvastatin  for 1 month as directed  *If you need a refill on your cardiac medications before your next appointment, please call your pharmacy*  Lab Work: CMET, CBC, TSH, Magnesium, B12 today  Testing/Procedures: NONE ordered at this time of appointment   Follow-Up: At [facility], you and your health needs are our priority.  As part of our continuing mission to provide you with exceptional heart care, our providers are all part of one team.  This team includes your primary Cardiologist (physician) and Advanced Practice Providers or APPs (Physician Assistants and Nurse Practitioners) who all work together to provide you with the care you need, when you need it.  Your next appointment:   6 week(s)  Provider:   Alm Clay, MD or Damien Braver, NP          We recommend signing up for the patient portal called MyChart.  Sign up information is provided on this After Visit Summary.  MyChart is used to connect with patients for Virtual Visits (Telemedicine).  Patients are able to view lab/test results, encounter notes, upcoming appointments, etc.  Non-urgent messages can be sent to your provider as well.   To learn more about what you can do with MyChart, go to [email or web address].   Other Instructions

## 2024-02-19 NOTE — Telephone Encounter (Signed)
 Called the patient and got his information and put it into the [facility] on [email or web address]. Printed off results and will mail to the patient. Also gave him the number to call- physician referral line [phone] for help.   He verbalized understanding.

## 2024-02-19 NOTE — Progress Notes (Signed)
 "  Office Visit    Patient Name: Pedro Kent Date of Encounter: 02/19/2024  Primary Care Provider:  Patient, No Pcp Per Primary Cardiologist:  Alm Clay, MD  Chief Complaint    83 year old male with a history of hypertension, hyperlipidemia, carotid artery stenosis, aortic root dilation, DVT, prostate cancer s/p radiation seed, and adrenal hyperplasia who presents for follow-up related to hypertension, dizziness and fatigue.   Past Medical History    Past Medical History:  Diagnosis Date   Adrenal hyperplasia    Cancer (HCC) 2010   prostate cancer   DVT (deep venous thrombosis) (HCC)    Hyperlipidemia    Hypertension    Past Surgical History:  Procedure Laterality Date   2-D echocardiogram  03/10/2009   Normal left ventricular systolic function. Mild MR. Normal RV systolic pressure. Mild pulmonic valvular regurgitation.   Cardiac stress test  04/05/2010   Negative for ischemia    Allergies  Allergies[1]   Labs/Other Studies Reviewed    The following studies were reviewed today:  Cardiac Studies & Procedures   ______________________________________________________________________________________________   STRESS TESTS  NM MYOCAR MULTI W/SPECT W 04/05/2010   ECHOCARDIOGRAM  ECHOCARDIOGRAM COMPLETE 01/15/2022  Narrative ECHOCARDIOGRAM REPORT    Patient Name:   Pedro Kent Date of Exam: 01/15/2022 Medical Rec #:  [id number]       Height:       72.0 in Accession #:    [id number]      Weight:       192.9 lb Date of Birth:  [birth year]       BSA:          2.098 m Patient Age:    81 years        BP:           151/84 mmHg Patient Gender: M               HR:           84 bpm. Exam Location:  Outpatient  Procedure: 2D Echo, 3D Echo, Cardiac Doppler and Color Doppler  Indications:    R53.83 Fatigue; R42 Lightheaded  History:        Patient has prior history of Echocardiogram examinations, most recent 09/23/2012.  Signs/Symptoms:Dizziness/Lightheadedness; Risk Factors:Hypertension, Dyslipidemia and Current Smoker. Patient denies chest pain, SOB or leg edema. Patient complains of overall fatigue, loss of stamina and lightheadedness with vertigo.  Sonographer:    Annabella Cater RVT, RDCS (AE), RDMS Referring Phys: [phone] [facility]   Sonographer Comments: Suboptimal apical window and suboptimal subcostal window. Image acquisition challenging due to respiratory motion. IMPRESSIONS   1. Left ventricular ejection fraction, by estimation, is 60 to 65%. The left ventricle has normal function. The left ventricle has no regional wall motion abnormalities. There is moderate asymmetric left ventricular hypertrophy of the basal-septal segment. Left ventricular diastolic parameters are consistent with Grade II diastolic dysfunction (pseudonormalization). Elevated left atrial pressure. 2. Right ventricular systolic function is normal. The right ventricular size is normal. 3. Left atrial size was moderately dilated. 4. The mitral valve is abnormal. Mild mitral valve regurgitation. No evidence of mitral stenosis. Severe mitral annular calcification. 5. The aortic valve is tricuspid. Aortic valve regurgitation is not visualized. Aortic valve sclerosis/calcification is present, without any evidence of aortic stenosis. 6. Aortic dilatation noted. There is dilatation of the aortic root, measuring 41 mm.  Comparison(s): EF 60%, MAC, mild LVH.  FINDINGS Left Ventricle: Left ventricular ejection fraction, by estimation, is 60 to  65%. The left ventricle has normal function. The left ventricle has no regional wall motion abnormalities. The left ventricular internal cavity size was normal in size. There is moderate asymmetric left ventricular hypertrophy of the basal-septal segment. Left ventricular diastolic parameters are consistent with Grade II diastolic dysfunction (pseudonormalization). Elevated left atrial  pressure.  Right Ventricle: The right ventricular size is normal. No increase in right ventricular wall thickness. Right ventricular systolic function is normal.  Left Atrium: Left atrial size was moderately dilated.  Right Atrium: Right atrial size was normal in size.  Pericardium: There is no evidence of pericardial effusion.  Mitral Valve: The mitral valve is abnormal. Severe mitral annular calcification. Mild mitral valve regurgitation. No evidence of mitral valve stenosis.  Tricuspid Valve: The tricuspid valve is normal in structure. Tricuspid valve regurgitation is mild.  Aortic Valve: The aortic valve is tricuspid. Aortic valve regurgitation is not visualized. Aortic valve sclerosis/calcification is present, without any evidence of aortic stenosis. Aortic valve mean gradient measures 4.0 mmHg. Aortic valve peak gradient measures 8.3 mmHg. Aortic valve area, by VTI measures 2.36 cm.  Pulmonic Valve: The pulmonic valve was not well visualized. Pulmonic valve regurgitation is mild.  Aorta: Aortic dilatation noted. There is dilatation of the aortic root, measuring 41 mm.  IAS/Shunts: The interatrial septum was not well visualized.   LEFT VENTRICLE PLAX 2D LVIDd:         3.55 cm     Diastology LVIDs:         2.25 cm     LV e' medial:    5.55 cm/s LV PW:         1.00 cm     LV E/e' medial:  16.3 LV IVS:        1.38 cm     LV e' lateral:   6.20 cm/s LVOT diam:     2.30 cm     LV E/e' lateral: 14.6 LV SV:         78 LV SV Index:   37 LVOT Area:     4.15 cm  3D Volume EF: LV Volumes (MOD)           3D EF:        59 % LV vol d, MOD A2C: 49.0 ml LV EDV:       88 ml LV vol d, MOD A4C: 82.4 ml LV ESV:       36 ml LV vol s, MOD A2C: 12.5 ml LV SV:        52 ml LV vol s, MOD A4C: 17.9 ml LV SV MOD A2C:     36.5 ml LV SV MOD A4C:     82.4 ml LV SV MOD BP:      48.6 ml  RIGHT VENTRICLE RV S prime:     12.20 cm/s TAPSE (M-mode): 1.8 cm  LEFT ATRIUM              Index         RIGHT ATRIUM           Index LA diam:        3.70 cm  1.76 cm/m   RA Area:     14.60 cm LA Vol (A2C):   98.2 ml  46.80 ml/m  RA Volume:   36.60 ml  17.44 ml/m LA Vol (A4C):   87.4 ml  41.66 ml/m LA Biplane Vol: 100.0 ml 47.66 ml/m AORTIC VALVE  PULMONIC VALVE AV Area (Vmax):    2.42 cm      PV Vmax:          0.86 m/s AV Area (Vmean):   2.11 cm      PV Peak grad:     3.0 mmHg AV Area (VTI):     2.36 cm      PR End Diast Vel: 3.88 msec AV Vmax:           144.00 cm/s AV Vmean:          101.000 cm/s AV VTI:            0.329 m AV Peak Grad:      8.3 mmHg AV Mean Grad:      4.0 mmHg LVOT Vmax:         83.80 cm/s LVOT Vmean:        51.200 cm/s LVOT VTI:          0.187 m LVOT/AV VTI ratio: 0.57  AORTA Ao Root diam: 4.10 cm Ao Asc diam:  3.70 cm Ao Arch diam: 3.8 cm  MITRAL VALVE                TRICUSPID VALVE MV Area (PHT): 3.08 cm     TR Peak grad:   32.9 mmHg MV Decel Time: 246 msec     TR Vmax:        287.00 cm/s MV E velocity: 90.70 cm/s MV A velocity: 117.00 cm/s  SHUNTS MV E/A ratio:  0.78         Systemic VTI:  0.19 m Systemic Diam: 2.30 cm  Lonni Nanas MD Electronically signed by Lonni Nanas MD Signature Date/Time: 01/16/2022/10:39:05 AM    Final          ______________________________________________________________________________________________     Recent Labs: 11/06/2023: Hemoglobin 13.9; Platelets 193 12/19/2023: BUN 11; Creatinine, Ser 1.12; Potassium 4.1; Sodium 135  Recent Lipid Panel    Component Value Date/Time   CHOL 133 06/25/2022 1418   TRIG 53 06/25/2022 1418   HDL 61 06/25/2022 1418   CHOLHDL 2.2 06/25/2022 1418   CHOLHDL 2.9 05/16/2016 1127   VLDL 14 05/16/2016 1127   LDLCALC 60 06/25/2022 1418   LDLDIRECT 55 12/26/2021 1448    History of Present Illness    83 year old male with the above past medical history including hypertension, hyperlipidemia, carotid artery stenosis, aortic root  dilation, DVT, prostate cancer s/p radiation seed, and adrenal hyperplasia.   He is on Eliquis  for history of prior DVT.  He has followed with Dr. Burnard primarily for the management of hypertension.  He has required titration of his medication regimen in the setting of ongoing elevated BP, concern for side effects.  Echocardiogram in November 2023 showed EF 60 to 65%, moderate LVH, G2 DD, mild MR, mild dilation of aortic root measuring 41 mm.  Carotid ultrasound in November 2023 showed 1 to 39% bilateral ICA stenoses.  At his follow-up visit in 09/2022 he reported mild dizziness and fatigue, labile BP.  He felt that his symptoms were likely related to tamsulosin .  He declined sleep study, echocardiogram, or stress testing.  Resting heart rate was in the 90s.  He was advised to increase his metoprolol  to 25 mg twice daily.  Chlorthalidone  was discontinued in the setting of hyponatremia.  He was last seen in the office on 01/06/2024 and was stable from a cardiac standpoint.  BP remained elevated.  He was transitioned from metoprolol  to carvedilol .  He  reported intermittent lightheadedness, generalized fatigue.  He declined any additional testing.   He presents today for follow-up accompanied by his niece.  Since his last visit he has been stable from a cardiac standpoint.  He continues to report symptoms of fatigue, bone/joint pain, unsteady gait, decreased appetite and cloudy vision.  He denies chest pain, palpitations, dizziness, presyncope, syncope, dyspnea, edema, PND, orthopnea, weight gain.  Home Medications    Current Outpatient Medications  Medication Sig Dispense Refill   amLODipine  (NORVASC ) 5 MG tablet Take 1 tablet (5 mg total) by mouth daily. 90 tablet 2   apixaban  (ELIQUIS ) 5 MG TABS tablet Take 1 tablet (5 mg total) by mouth 2 (two) times daily. 60 tablet 5   carvedilol  (COREG ) 6.25 MG tablet Take 1 tablet (6.25 mg total) by mouth 2 (two) times daily. 180 tablet 3   Green Tea, Camillia  sinensis, (GREEN TEA PO) Take by mouth. Cup of Tea daily.     Multiple Vitamins-Minerals (MULTIVITAMIN) tablet Take 1 tablet by mouth daily.     Nutritional Supplements (ENSURE COMPLETE PO) Take by mouth daily.     olmesartan  (BENICAR ) 40 MG tablet Take 1 tablet (40 mg total) by mouth daily. 90 tablet 1   silodosin (RAPAFLO) 8 MG CAPS capsule Take 8 mg by mouth daily. (Patient taking differently: Take 4 mg by mouth daily.)     atorvastatin  (LIPITOR) 10 MG tablet Take 1 tablet (10 mg total) by mouth daily. (Patient not taking: Reported on 02/19/2024) 90 tablet 3   MELATONIN GUMMIES PO Take 5 mg by mouth daily. (Patient not taking: Reported on 02/19/2024)     No current facility-administered medications for this visit.     Review of Systems   He denies chest pain, palpitations, dyspnea, pnd, orthopnea, n, v, dizziness, syncope, edema, weight gain, or early satiety. All other systems reviewed and are otherwise negative except as noted above.   Physical Exam    VS:  BP 130/88 (BP Location: Left Arm, Patient Position: Sitting, Cuff Size: Normal)   Pulse 87   Ht 6' (1.829 m)   Wt 206 lb (93.4 kg)   SpO2 97%   BMI 27.94 kg/m   STOP-Bang Score:         GEN: Well nourished, well developed, in no acute distress. HEENT: normal. Neck: Supple, no JVD, carotid bruits, or masses. Cardiac: RRR, no murmurs, rubs, or gallops. No clubbing, cyanosis, edema.  Radials/DP/PT 2+ and equal bilaterally.  Respiratory:  Respirations regular and unlabored, clear to auscultation bilaterally. GI: Soft, nontender, nondistended, BS + x 4. MS: no deformity or atrophy. Skin: warm and dry, no rash. Neuro:  Strength and sensation are intact. Psych: Normal affect.  Accessory Clinical Findings    ECG personally reviewed by me today -    - no EKG in office today.    Lab Results  Component Value Date   WBC 4.9 11/06/2023   HGB 13.9 11/06/2023   HCT 40.3 11/06/2023   MCV 104 (H) 11/06/2023   PLT 193 11/06/2023    Lab Results  Component Value Date   CREATININE 1.12 12/19/2023   BUN 11 12/19/2023   NA 135 12/19/2023   K 4.1 12/19/2023   CL 98 12/19/2023   CO2 23 12/19/2023   Lab Results  Component Value Date   ALT 34 06/25/2022   AST 32 06/25/2022   ALKPHOS 86 06/25/2022   BILITOT 0.7 06/25/2022   Lab Results  Component Value Date   CHOL 133 06/25/2022  HDL 61 06/25/2022   LDLCALC 60 06/25/2022   LDLDIRECT 55 12/26/2021   TRIG 53 06/25/2022   CHOLHDL 2.2 06/25/2022    Lab Results  Component Value Date   HGBA1C 5.8 (H) 11/09/2021    Assessment & Plan    1. Fatigue/lightheadedness/joint pain/cloudy vision: Echocardiogram in November 2023 showed EF 60 to 65%, moderate LVH, G2 DD, mild MR, mild dilation of aortic root measuring 41 mm.  Carotid ultrasound in November 2023 showed 1 to 39% bilateral ICA stenoses.  Symptoms have been persistent. In reviewing his chart, some of these symptoms have been ongoing for greater than 1 year, despite medication changes. We discussed repeat echocardiogram, carotid ultrasound, he declines today.  Previously declined ischemic evaluation. Question symptoms symptoms could be a side effect of medication.  Will once again trial statin holiday (recommend 1 month statin holiday).  If no improvement, resume statin.  Could also consider discontinuation of olmesartan  to see if symptoms improve.  Recommend he establish with a PCP as it is possible that his symptoms are not related to medication, or are noncardiac. List of PCP providers given in office today.  Will check CBC, CMET, TSH, B12, folate, and magnesium.  He is overdue for an eye exam. Reviewed ED precautions.   2. Hypertension: BP well controlled.  He did not tolerate chlorthalidone  due to hyponatremia. Consider discontinuation of olmesartan  if above symptoms persist.  For now, continue current antihypertensive regimen.     3.  History of DVT: Continue Eliquis .  Denies bleeding.  Repeat CBC, CMET pending  as above.  4. Carotid artery stenosis: 1 to 39% bilateral carotid artery stenosis noted on carotid ultrasound in 12/2021.  He does note cloudy vision.  Denies any dizziness, presyncope or syncope. He is due for repeat carotid ultrasound, we discussed this at today's visit, he declines.  Can revisit at follow-up.    5. Aortic root dilation: Aortic root measured 41 mm on most recent echo.  Repeat echocardiogram was previously ordered but not completed.  We discussed repeat echocardiogram today, he declines. Can revisit at follow-up.   6. Hyperlipidemia: LDL was 60 in 06/2022.  Due for repeat fasting lipids, consider at follow-up visit.  Continue atorvastatin  as above (See #2).  7. Sleep disordered breathing: Declines sleep study.    8. Disposition: Follow-up in 6 weeks.      Damien JAYSON Braver, NP 02/19/2024, 2:36 PM       [1]  Allergies Allergen Reactions   Ace Inhibitors Other (See Comments)    Unknown   "

## 2024-02-19 NOTE — Telephone Encounter (Signed)
 Patient niece states she was supposed to get ask a list of primary care providers, but did not get it. They are asking the list be sent in the mail. Please advise.

## 2024-02-20 LAB — COMPREHENSIVE METABOLIC PANEL WITH GFR
ALT: 38 IU/L (ref 0–44)
AST: 27 IU/L (ref 0–40)
Albumin: 4 g/dL (ref 3.7–4.7)
Alkaline Phosphatase: 86 IU/L (ref 48–129)
BUN/Creatinine Ratio: 14 (ref 10–24)
BUN: 15 mg/dL (ref 8–27)
Bilirubin Total: 0.7 mg/dL (ref 0.0–1.2)
CO2: 24 mmol/L (ref 20–29)
Calcium: 9.4 mg/dL (ref 8.6–10.2)
Chloride: 101 mmol/L (ref 96–106)
Creatinine, Ser: 1.06 mg/dL (ref 0.76–1.27)
Globulin, Total: 2.7 g/dL (ref 1.5–4.5)
Glucose: 89 mg/dL (ref 70–99)
Potassium: 4.2 mmol/L (ref 3.5–5.2)
Sodium: 138 mmol/L (ref 134–144)
Total Protein: 6.7 g/dL (ref 6.0–8.5)
eGFR: 69 mL/min/1.73

## 2024-02-20 LAB — CBC
Hematocrit: 44.9 % (ref 37.5–51.0)
Hemoglobin: 15 g/dL (ref 13.0–17.7)
MCH: 35.9 pg — ABNORMAL HIGH (ref 26.6–33.0)
MCHC: 33.4 g/dL (ref 31.5–35.7)
MCV: 107 fL — ABNORMAL HIGH (ref 79–97)
Platelets: 177 x10E3/uL (ref 150–450)
RBC: 4.18 x10E6/uL (ref 4.14–5.80)
RDW: 12.5 % (ref 11.6–15.4)
WBC: 4.7 x10E3/uL (ref 3.4–10.8)

## 2024-02-20 LAB — B12 AND FOLATE PANEL
Folate: >20 ng/mL
Vitamin B-12: 1369 pg/mL — ABNORMAL HIGH (ref 232–1245)

## 2024-02-20 LAB — MAGNESIUM: Magnesium: 1.8 mg/dL (ref 1.6–2.3)

## 2024-02-20 LAB — TSH: TSH: 0.978 u[IU]/mL (ref 0.450–4.500)

## 2024-02-26 ENCOUNTER — Ambulatory Visit: Payer: Self-pay | Admitting: Nurse Practitioner

## 2024-02-26 NOTE — Telephone Encounter (Signed)
 Pt calling in to f/u on lab results. Please advise.

## 2024-03-02 ENCOUNTER — Other Ambulatory Visit: Payer: Self-pay | Admitting: Cardiology

## 2024-03-03 MED ORDER — AMLODIPINE BESYLATE 5 MG PO TABS: 5.0000 mg | ORAL_TABLET | Freq: Every day | ORAL | 2 refills | Status: AC

## 2024-03-30 ENCOUNTER — Ambulatory Visit: Admitting: Nurse Practitioner
